# Patient Record
Sex: Female | Born: 1960 | Race: White | Hispanic: No | Marital: Married | Smoking: Current every day smoker
Health system: Southern US, Community
[De-identification: ages and names within clinical notes are randomized; demographics above are authoritative.]

## PROBLEM LIST (undated history)

## (undated) DIAGNOSIS — D126 Benign neoplasm of colon, unspecified: Secondary | ICD-10-CM

## (undated) DIAGNOSIS — F419 Anxiety disorder, unspecified: Secondary | ICD-10-CM

## (undated) DIAGNOSIS — D099 Carcinoma in situ, unspecified: Secondary | ICD-10-CM

## (undated) DIAGNOSIS — K5790 Diverticulosis of intestine, part unspecified, without perforation or abscess without bleeding: Secondary | ICD-10-CM

## (undated) DIAGNOSIS — K648 Other hemorrhoids: Secondary | ICD-10-CM

## (undated) DIAGNOSIS — G709 Myoneural disorder, unspecified: Secondary | ICD-10-CM

## (undated) DIAGNOSIS — M8580 Other specified disorders of bone density and structure, unspecified site: Secondary | ICD-10-CM

## (undated) DIAGNOSIS — E785 Hyperlipidemia, unspecified: Secondary | ICD-10-CM

## (undated) DIAGNOSIS — F329 Major depressive disorder, single episode, unspecified: Secondary | ICD-10-CM

## (undated) DIAGNOSIS — C801 Malignant (primary) neoplasm, unspecified: Secondary | ICD-10-CM

## (undated) DIAGNOSIS — E079 Disorder of thyroid, unspecified: Secondary | ICD-10-CM

## (undated) DIAGNOSIS — M25559 Pain in unspecified hip: Secondary | ICD-10-CM

## (undated) DIAGNOSIS — F32A Depression, unspecified: Secondary | ICD-10-CM

## (undated) HISTORY — DX: Hyperlipidemia, unspecified: E78.5

## (undated) HISTORY — DX: Disorder of thyroid, unspecified: E07.9

## (undated) HISTORY — DX: Malignant (primary) neoplasm, unspecified: C80.1

## (undated) HISTORY — DX: Depression, unspecified: F32.A

## (undated) HISTORY — DX: Anxiety disorder, unspecified: F41.9

## (undated) HISTORY — DX: Other hemorrhoids: K64.8

## (undated) HISTORY — PX: BREAST BIOPSY: SHX20

## (undated) HISTORY — PX: MOHS SURGERY: SHX181

## (undated) HISTORY — PX: WISDOM TOOTH EXTRACTION: SHX21

## (undated) HISTORY — PX: COLONOSCOPY: SHX174

## (undated) HISTORY — DX: Carcinoma in situ, unspecified: D09.9

## (undated) HISTORY — PX: POLYPECTOMY: SHX149

## (undated) HISTORY — PX: CERVICAL CONE BIOPSY: SUR198

## (undated) HISTORY — DX: Diverticulosis of intestine, part unspecified, without perforation or abscess without bleeding: K57.90

## (undated) HISTORY — DX: Myoneural disorder, unspecified: G70.9

## (undated) HISTORY — DX: Benign neoplasm of colon, unspecified: D12.6

## (undated) HISTORY — PX: PARATHYROIDECTOMY: SHX19

## (undated) HISTORY — DX: Major depressive disorder, single episode, unspecified: F32.9

## (undated) HISTORY — DX: Other specified disorders of bone density and structure, unspecified site: M85.80

## (undated) HISTORY — DX: Pain in unspecified hip: M25.559

---

## 1989-06-24 DIAGNOSIS — D099 Carcinoma in situ, unspecified: Secondary | ICD-10-CM

## 1989-06-24 HISTORY — DX: Carcinoma in situ, unspecified: D09.9

## 2008-08-22 HISTORY — PX: ABDOMINAL HYSTERECTOMY: SHX81

## 2009-08-23 ENCOUNTER — Other Ambulatory Visit: Admission: RE | Admit: 2009-08-23 | Discharge: 2009-08-23 | Payer: Self-pay | Admitting: Obstetrics and Gynecology

## 2009-09-01 ENCOUNTER — Encounter: Admission: RE | Admit: 2009-09-01 | Discharge: 2009-09-01 | Payer: Self-pay | Admitting: Obstetrics and Gynecology

## 2009-11-08 ENCOUNTER — Ambulatory Visit: Payer: Self-pay | Admitting: Internal Medicine

## 2009-11-08 DIAGNOSIS — F329 Major depressive disorder, single episode, unspecified: Secondary | ICD-10-CM

## 2009-11-08 DIAGNOSIS — F411 Generalized anxiety disorder: Secondary | ICD-10-CM | POA: Insufficient documentation

## 2009-11-08 DIAGNOSIS — D069 Carcinoma in situ of cervix, unspecified: Secondary | ICD-10-CM | POA: Insufficient documentation

## 2009-11-08 DIAGNOSIS — F17200 Nicotine dependence, unspecified, uncomplicated: Secondary | ICD-10-CM

## 2009-11-08 DIAGNOSIS — R635 Abnormal weight gain: Secondary | ICD-10-CM

## 2009-11-08 DIAGNOSIS — E785 Hyperlipidemia, unspecified: Secondary | ICD-10-CM | POA: Insufficient documentation

## 2009-11-08 LAB — CONVERTED CEMR LAB
AST: 15 units/L (ref 0–37)
Calcium: 9.4 mg/dL (ref 8.4–10.5)
Chloride: 105 meq/L (ref 96–112)
Cholesterol: 261 mg/dL — ABNORMAL HIGH (ref 0–200)
Creatinine, Ser: 0.6 mg/dL (ref 0.4–1.2)
Free T4: 0.6 ng/dL (ref 0.6–1.6)
GFR calc non Af Amer: 105.76 mL/min (ref >60)
HDL: 68.3 mg/dL (ref >39.00)
Lymphocytes Relative: 31.9 % (ref 12.0–46.0)
Lymphs Abs: 2 10*3/uL (ref 0.7–4.0)
MCHC: 34.7 g/dL (ref 30.0–36.0)
Monocytes Absolute: 0.5 10*3/uL (ref 0.1–1.0)
Monocytes Relative: 7.3 % (ref 3.0–12.0)
Neutrophils Relative %: 58.2 % (ref 43.0–77.0)
Platelets: 213 10*3/uL (ref 150.0–400.0)
Potassium: 4.6 meq/L (ref 3.5–5.1)
RDW: 12.9 % (ref 11.5–14.6)
TSH: 1.49 microintl units/mL (ref 0.35–5.50)
Total Bilirubin: 0.5 mg/dL (ref 0.3–1.2)
Total CHOL/HDL Ratio: 4
Triglycerides: 102 mg/dL (ref 0.0–149.0)
VLDL: 20.4 mg/dL (ref 0.0–40.0)

## 2009-12-15 ENCOUNTER — Ambulatory Visit: Payer: Self-pay | Admitting: Internal Medicine

## 2009-12-19 ENCOUNTER — Ambulatory Visit: Payer: Self-pay | Admitting: Family Medicine

## 2009-12-19 ENCOUNTER — Encounter: Payer: Self-pay | Admitting: Internal Medicine

## 2009-12-19 DIAGNOSIS — N760 Acute vaginitis: Secondary | ICD-10-CM | POA: Insufficient documentation

## 2009-12-22 ENCOUNTER — Telehealth: Payer: Self-pay | Admitting: Family Medicine

## 2009-12-22 ENCOUNTER — Encounter: Payer: Self-pay | Admitting: *Deleted

## 2009-12-22 LAB — CONVERTED CEMR LAB: Chlamydia, DNA Probe: NEGATIVE

## 2010-01-17 ENCOUNTER — Ambulatory Visit: Payer: Self-pay | Admitting: Internal Medicine

## 2010-01-17 DIAGNOSIS — R209 Unspecified disturbances of skin sensation: Secondary | ICD-10-CM | POA: Insufficient documentation

## 2010-01-17 DIAGNOSIS — R234 Changes in skin texture: Secondary | ICD-10-CM

## 2010-01-24 ENCOUNTER — Encounter: Payer: Self-pay | Admitting: Internal Medicine

## 2010-02-05 ENCOUNTER — Other Ambulatory Visit: Admission: RE | Admit: 2010-02-05 | Discharge: 2010-02-05 | Payer: Self-pay | Admitting: Obstetrics and Gynecology

## 2010-07-24 NOTE — Assessment & Plan Note (Signed)
Summary: swelling in finger/tingly from elbow to hand/skin on finger l...   Vital Signs:  Patient profile:   50 year old female Menstrual status:  hysterectomy Weight:      162 pounds Pulse rate:   72 / minute BP sitting:   120 / 80  (left arm) Cuff size:   regular  Vitals Entered By: Romualdo Bolk, CMA (AAMA) (January 17, 2010 4:04 PM) CC: 1) Swelling in left ring finger since May. Pt states that it is swollen so much that she has trouble taking off her wedding band. Pt also has a knot under the skin on the finger. 2) Pt has tingling from elbow down on her left side that started 1 month ago. All her fingers are tingling. 3) Pt has soreness in her neck on left side that started 1 month ago as well. She states that this started when she was moving. 4) Pt also has bump on the outside of her vaginal area. Pt has seen Dr. Abner Greenspan about this. Pt has taken the probiotic and it went away but came back. It is sore and under the skin. The skin feels bruised. Pt states that she didn't take the diflucan that was rx.   History of Present Illness: Jessica Cooper comes in today  for SDA for above problems . 1.  Lump in vaginal area  came back  had eval per dr Rogelia Rohrer ? B vsyeast  .  now has days of tender srea left  vaginal area.    2.  feeling of hand swelling and ring tight on left  and then  tingling  pinky area and then when driving   web of thumb on same hand.   no elbow pain but tingling genreates distal arm.    No cp sob  . No trauma but moved recently and had some pain left upper back neck area  . No weakness ot numbness   that is persistent .    Has color change on ring finger  . concerning to her.   Preventive Screening-Counseling & Management  Alcohol-Tobacco     Alcohol drinks/day: 2     Alcohol type: wine     Smoking Status: current     Smoke Cessation Stage: contemplative     Packs/Day: 1.0     Tobacco Counseling: to quit use of tobacco products  Caffeine-Diet-Exercise     Caffeine  use/day: 4     Does Patient Exercise: no  Current Medications (verified): 1)  Citalopram Hydrobromide 10 Mg Tabs (Citalopram Hydrobromide) .Marland Kitchen.. 1 By Mouth Once Daily 2)  Multivitamins   Tabs (Multiple Vitamin) 3)  Vitamin D 1000 Unit  Tabs (Cholecalciferol) 4)  Glucosamine-Chondroitin   Caps (Glucosamine-Chondroit-Vit C-Mn) 5)  Fluconazole 150 Mg Tabs (Fluconazole) .Marland Kitchen.. 1 Tab By Mouth Once 6)  Align  Caps (Probiotic Product)  Allergies (verified): 1)  ! Flagyl  Past History:  Past medical, surgical, family and social histories (including risk factors) reviewed, and no changes noted (except as noted below).  Past Medical History: Reviewed history from 11/08/2009 and no changes required. Depression/ anxiety  Hyperlipidemia about 245  G0P0 Carcinoma in situ 1991   conization   Past Surgical History: Reviewed history from 11/08/2009 and no changes required. Hysterectomy 3 2010  fibroid bleeding.   Past History:  Care Management: Gynecology: Dr Thomasena Edis  Family History: Reviewed history from 11/08/2009 and no changes required. Father: CHF, HBP  ,  heart valve.  Mother: None Siblings:Sister Breast Cancer MGM :  CHF    Social History: Reviewed history from 11/08/2009 and no changes required. Occupation: Dietitian degree Married Current Smoker Alcohol use-yes Drug use-no Regular exercise-no Hhof 2  Moved from [place]  for job.   Review of Systems  The patient denies anorexia, fever, weight loss, weight gain, vision loss, dyspnea on exertion, hematuria, incontinence, genital sores, muscle weakness, difficulty walking, abnormal bleeding, enlarged lymph nodes, and angioedema.    Physical Exam  General:  Well-developed,well-nourished,in no acute distress; alert,appropriate and cooperative throughout examination Head:  normocephalic and atraumatic.   Neck:  No deformities, masses, or tenderness noted. no bony tenderness  Genitalia:  lef labia with  1-1.5 cm   slightly tender cystic nodule lower labia  no lesion ulcers or warts .   No rashes or adenopathy  Msk:  no joint tenderness and no joint swelling.   left ring finger with  indurated thickening of skin over the  proximal finger   slightly darkened .  Nl temp of fingers     no joint swelling otherwise  rom ulbow is normal    some tendernenss left trapezius  Pulses:  pulses intact without delay   Extremities:  no clubbing or cyanosis Neurologic:  alert & oriented X3, strength normal in all extremities, and DTRs symmetrical and normal.    nl grip strength  Skin:  turgor normal, color normal, no suspicious lesions, and no ecchymoses.   Cervical Nodes:  No lymphadenopathy noted Inguinal Nodes:  No significant adenopathy Psych:  Oriented X3.     Impression & Recommendations:  Problem # 1:  TINGLING (ICD-782.0)  left arm  ulnar side but also  medial side  ?  compression issue  no   alarm .sx such as weakness  . with below will get  hand UE consult .   Orders: Orthopedic Referral (Ortho)  Problem # 2:  CHANGES IN SKIN TEXTURE (ICD-782.8)  ring finger   poss from chronic changes form  ring and swelling  but pt very concerned  about this.      Orders: Orthopedic Referral (Ortho)  Problem # 3:  ? of BARTHOLIN'S CYST (ICD-616.2) left    not alarming   disc about poss dx  no evidence of sti.     review labs done by Dr Rogelia Rohrer and negative   for screening   Complete Medication List: 1)  Citalopram Hydrobromide 10 Mg Tabs (Citalopram hydrobromide) .Marland Kitchen.. 1 by mouth once daily 2)  Multivitamins Tabs (Multiple vitamin) 3)  Vitamin D 1000 Unit Tabs (Cholecalciferol) 4)  Glucosamine-chondroitin Caps (Glucosamine-chondroit-vit c-mn) 5)  Fluconazole 150 Mg Tabs (Fluconazole) .Marland Kitchen.. 1 tab by mouth once 6)  Align Caps (Probiotic product) 7)  Keflex 500 Mg Caps (Cephalexin) .Marland Kitchen.. 1 by mouth three times a day  for 7 days  Patient Instructions: 1)  warm compresses at least 3 x per day for 5 mintues or so   can add antibiotic if getting larger. 2)  This could be a bartholins gland  3)  call if persistent or  progressive  .  4)  Will contact you about  referral for tingling in left arm and shkin changes over your ring finger .   5)  avoid leaning on elbow    in the meantime  Prescriptions: KEFLEX 500 MG CAPS (CEPHALEXIN) 1 by mouth three times a day  for 7 days  #21 x 0   Entered and Authorized by:   Madelin Headings MD   Signed by:  Madelin Headings MD on 01/17/2010   Method used:   Print then Give to Patient   RxID:   [id number]

## 2010-07-24 NOTE — Progress Notes (Signed)
Summary: lab results  Phone Note Call from Patient   Caller: Patient Call For: Dr. Abner Greenspan Summary of Call: [phone] Pt is asking for lab results. Initial call taken by: Lynann Beaver CMA,  December 22, 2009 12:01 PM  Follow-up for Phone Call        spoke with patient over phone still having some mild discharge, although it is improved slightly on probiotics, wet prep was not collected appropriately so results on yeast not available and patient  has had yeast with good response to Diflucan in the past. Follow-up by: Danise Edge MD,  December 22, 2009 1:48 PM    New/Updated Medications: FLUCONAZOLE 150 MG TABS (FLUCONAZOLE) 1 tab by mouth once Prescriptions: FLUCONAZOLE 150 MG TABS (FLUCONAZOLE) 1 tab by mouth once  #1 x 0   Entered and Authorized by:   Danise Edge MD   Signed by:   Danise Edge MD on 12/22/2009   Method used:   Electronically to        [facility] [phone]* (retail)       [address]       [place], [place]  [address]       Ph: [phone] or [phone]       Fax: [phone]   RxID:   [id number]

## 2010-07-24 NOTE — Assessment & Plan Note (Signed)
Summary: Follow up/cb   Vital Signs:  Patient profile:   50 year old female Menstrual status:  hysterectomy Weight:      161 pounds Pulse rate:   66 / minute BP sitting:   100 / 60  (right arm) Cuff size:   regular  Vitals Entered By: Romualdo Bolk, CMA (AAMA) (December 15, 2009 3:11 PM) CC: Follow-up visit on labs   History of Present Illness: Jessica Cooper comes in today  for follow up of labs  and pv issues  from last  visit .  Since last visit  here  there have been no major changes in health status  .  Still smoking . No new issues . to discuss.   Preventive Screening-Counseling & Management  Alcohol-Tobacco     Alcohol drinks/day: 2     Alcohol type: wine     Smoking Status: current     Smoke Cessation Stage: contemplative     Packs/Day: 1.0     Tobacco Counseling: to quit use of tobacco products  Caffeine-Diet-Exercise     Caffeine use/day: 4     Does Patient Exercise: no  Hep-HIV-STD-Contraception     Dental Visit-last 6 months yes     Sun Exposure-Excessive: no  Safety-Violence-Falls     Seat Belt Use: yes     Firearms in the Home: no firearms in the home     Smoke Detectors: yes      Blood Transfusions:  no.        Travel History:  [place].    Current Medications (verified): 1)  Citalopram Hydrobromide 10 Mg Tabs (Citalopram Hydrobromide) .Marland Kitchen.. 1 By Mouth Once Daily 2)  Multivitamins   Tabs (Multiple Vitamin) 3)  Vitamin D 1000 Unit  Tabs (Cholecalciferol) 4)  Glucosamine-Chondroitin   Caps (Glucosamine-Chondroit-Vit C-Mn)  Allergies (verified): 1)  ! Flagyl  Past History:  Past medical, surgical, family and social histories (including risk factors) reviewed, and no changes noted (except as noted below).  Past Medical History: Reviewed history from 11/08/2009 and no changes required. Depression/ anxiety  Hyperlipidemia about 245  G0P0 Carcinoma in situ 1991   conization   Past Surgical History: Reviewed history from 11/08/2009 and no  changes required. Hysterectomy 3 2010  fibroid bleeding.   Past History:  Care Management: Gynecology: Dr Thomasena Edis  Family History: Reviewed history from 11/08/2009 and no changes required. Father: CHF, HBP  ,  heart valve.  Mother: None Siblings:Sister Breast Cancer MGM : CHF    Social History: Reviewed history from 11/08/2009 and no changes required. Occupation: Dietitian degree Married Current Smoker Alcohol use-yes Drug use-no Regular exercise-no Hhof 2  Moved from [place]  for job. Dental Care w/in 6 mos.:  yes Sun Exposure-Excessive:  no  Review of Systems  The patient denies anorexia, fever, weight loss, weight gain, vision loss, decreased hearing, hoarseness, chest pain, prolonged cough, abdominal pain, melena, hematochezia, severe indigestion/heartburn, enlarged lymph nodes, angioedema, and breast masses.    Physical Exam  General:  Well-developed,well-nourished,in no acute distress; alert,appropriate and cooperative throughout examination Head:  normocephalic and atraumatic.   Eyes:  PERRL, EOMs full, conjunctiva clear  Ears:  R ear normal, L ear normal, and no external deformities.   Nose:  no external deformity, no external erythema, and no nasal discharge.   Mouth:  pharynx pink and moist.   Neck:  No deformities, masses, or tenderness noted. Breasts:  No mass, nodules, thickening, tenderness, bulging, retraction, inflamation, nipple discharge or skin changes noted.  Lungs:  Normal respiratory effort, chest expands symmetrically. Lungs are clear to auscultation, no crackles or wheezes. Heart:  Normal rate and regular rhythm. S1 and S2 normal without gallop, murmur, click, rub or other extra sounds. Abdomen:  Bowel sounds positive,abdomen soft and non-tender without masses, organomegaly or   noted. Msk:  no clubbing cyanosis or edema  Pulses:  pulses intact without delay   Neurologic:  alert & oriented X3, strength normal in all extremities,  and gait normal.   Skin:  turgor normal, color normal, no suspicious lesions, no ecchymoses, and no petechiae.   Cervical Nodes:  No lymphadenopathy noted Axillary Nodes:  No palpable lymphadenopathy Inguinal Nodes:  No significant adenopathy Psych:  Oriented X3, good eye contact, not anxious appearing, and not depressed appearing.   reviewed labs   Impression & Recommendations:  Problem # 1:  HYPERLIPIDEMIA (ICD-272.4) lifestyle intervention  discussed    consider ation of med but ratio ok and tobacco is her biggest risk factor.   Labs Reviewed: SGOT: 15 (11/08/2009)   SGPT: 15 (11/08/2009)   HDL:68.30 (11/08/2009)  Chol:261 (11/08/2009)  Trig:102.0 (11/08/2009)  Problem # 2:  TOBACCO USE (ICD-305.1) counseled  Encouraged smoking cessation and discussed different methods for smoking cessation.   Problem # 3:  ANXIETY (ICD-300.00)  Her updated medication list for this problem includes:    Citalopram Hydrobromide 10 Mg Tabs (Citalopram hydrobromide) .Marland Kitchen... 1 by mouth once daily  Problem # 4:  PREVENTIVE HEALTH CARE (ICD-V70.0) see parameters  and screening  discussed   Problem # 5:  ABNORMALITY OF RED BLOOD CELLS INC MCV (ICD-790.09) not hypothyroid and no hx of   b12 problems  apparently has had this  before   will follow . could take b12  can recheck periodically  with yearly cbc  Problem # 6:  WEIGHT GAIN (ICD-783.1) no other cause  although med can contribute.  advise with tobacco cessation weight can rise but minimal  if sig lifestyle intervention to counteract metabolic change.  Complete Medication List: 1)  Citalopram Hydrobromide 10 Mg Tabs (Citalopram hydrobromide) .Marland Kitchen.. 1 by mouth once daily 2)  Multivitamins Tabs (Multiple vitamin) 3)  Vitamin D 1000 Unit Tabs (Cholecalciferol) 4)  Glucosamine-chondroitin Caps (Glucosamine-chondroit-vit c-mn)  Patient Instructions: 1)  still rec stop smoking 2)  need  lifestyle intervention for lipids  and  at least yearly check    greater than 50% of visit spent in counseling  25 minutes.  Appended Document: Orders Update EKG  was performed at this visit .  Rate 58 sinus rhythm with nl intervals ,  poor r wave progression  . no acute changes  . No previous to compare  Tamala Ser  MD   Clinical Lists Changes  Orders: Added new Service order of EKG w/ Interpretation (93000) - Signed

## 2010-07-24 NOTE — Assessment & Plan Note (Signed)
Summary: vaginal infection/dm   Vital Signs:  Patient profile:   50 year old female Menstrual status:  hysterectomy Weight:      159 pounds (72.27 kg) O2 Sat:      95 % on Room air Temp:     98.4 degrees F (36.89 degrees C) oral Pulse rate:   76 / minute BP sitting:   120 / 80  (right arm) Cuff size:   regular  Vitals Entered By: Kathrynn Speed CMA (December 19, 2009 3:43 PM)  O2 Flow:  Room air CC: vaginal infection maybe bacterica, itching & burning wiith discharge/ src   History of Present Illness: Patient in today with a 1 week history of increased vaginal discharge. She reports 2 weeks ago her husband had some penile discharge and irritation and was treated with an antibiotic two times a day, was supposed to take 10 days. She is unsure of the name of the antibiotics. She notes the dischasrge is whitish with a mild odor. She is struggling with some burning and she feels a bump under the skin on the labia which is located roughly where she had some unknown ulcerative lesion present over 20 years ago. No f/c/malaise. No unusual back or abdominal pain. Has noted several loose stool a day for the past couple of days. No n/v/anorexia. No CP/palp/SOB.  Current Medications (verified): 1)  Citalopram Hydrobromide 10 Mg Tabs (Citalopram Hydrobromide) .Marland Kitchen.. 1 By Mouth Once Daily 2)  Multivitamins   Tabs (Multiple Vitamin) 3)  Vitamin D 1000 Unit  Tabs (Cholecalciferol) 4)  Glucosamine-Chondroitin   Caps (Glucosamine-Chondroit-Vit C-Mn)  Allergies (verified): 1)  ! Flagyl  Past History:  Past medical history reviewed for relevance to current acute and chronic problems. Social history (including risk factors) reviewed for relevance to current acute and chronic problems.  Past Medical History: Reviewed history from 11/08/2009 and no changes required. Depression/ anxiety  Hyperlipidemia about 245  G0P0 Carcinoma in situ 1991   conization   Social History: Reviewed history from  11/08/2009 and no changes required. Occupation: Dietitian degree Married Current Smoker Alcohol use-yes Drug use-no Regular exercise-no Hhof 2  Moved from [place]  for job.   Review of Systems      See HPI  Physical Exam  General:  Well-developed,well-nourished,in no acute distress; alert,appropriate and cooperative throughout examination Head:  Normocephalic and atraumatic without obvious abnormalities. . Mouth:  Oral mucosa and oropharynx without lesions or exudates. Neck:  No deformities, masses, or tenderness noted. Lungs:  Normal respiratory effort, chest expands symmetrically. Lungs are clear to auscultation, no crackles or wheezes. Heart:  Normal rate and regular rhythm. S1 and S2 normal without gallop, murmur, click, rub or other extra sounds. Abdomen:  Bowel sounds positive,abdomen soft and non-tender without masses Genitalia:  normal introitus, labial erythema, and vaginal discharge whitish and thin. 1cm soft, Nontender circular lesion at posterior aspect of left labia majora. No subrapubic tenderness, no adnexal masses or tenderness.   Impression & Recommendations:  Problem # 1:  UNSPECIFIED VAGINITIS AND VULVOVAGINITIS (ICD-616.10)  Orders: T-GC Probe, genital ([id number]) T-Chlamydia Probe, genital [phone]) T-RPR (Syphilis) [phone]) T-HIV Antibody  (Reflex) [id number]) T-Wet Prep for Christoper Allegra, Clue Cells ([id number]) T-Herpes Simplex Type 2 ([id number])  Did have copious white vaginal discharge and a small 1cm lesion, nt, soft Subcutaneously likely an enlarged LN at posterior aspect of left labia majora. Start Align capsule daily, use cotton undergarments, cleanse after showers externally with distilled white vinegar.  Problem # 2:  TOBACCO USE (  ICD-305.1) Continues to smoke a ppd reports she has been researching and is getting close to quitting. Encouraged complete cessation  Complete Medication List: 1)  Citalopram Hydrobromide  10 Mg Tabs (Citalopram hydrobromide) .Marland Kitchen.. 1 by mouth once daily 2)  Multivitamins Tabs (Multiple vitamin) 3)  Vitamin D 1000 Unit Tabs (Cholecalciferol) 4)  Glucosamine-chondroitin Caps (Glucosamine-chondroit-vit c-mn)  Patient Instructions: 1)  Please schedule a follow-up appointment as needed if symptoms worsen or do not resolve.  2)  Start Align probiotic daily for next month. 3)  Avoid harsh soaps, use cotton undergarments, cleanse externally w/ distilled vinegar after showering.

## 2010-07-24 NOTE — Consult Note (Signed)
Summary: [facility] [place]   Imported By: Maryln Gottron 01/31/2010 12:10:40  _____________________________________________________________________  External Attachment:    Type:   Image     Comment:   External Document

## 2010-07-24 NOTE — Assessment & Plan Note (Signed)
Summary: NEW PT EST // RS   Vital Signs:  Patient profile:   50 year old female Menstrual status:  hysterectomy Height:      65.5 inches Weight:      161 pounds BMI:     26.48 Temp:     97.6 degrees F oral BP sitting:   110 / 70  (right arm)  Vitals Entered By: Kathrynn Speed CMA (Nov 08, 2009 10:40 AM) CC: New pt estblish, pt fasting for Labs     Menstrual Status hysterectomy Last PAP Result normal   History of Present Illness: Jessica Cooper comes in today  for new visit.She  Needs a PCP . Sees Dr Thomasena Edis for  GYNE. Anxiety: on citalopram  for 6-7  years    low dose.     had hx of panic attacks   Last   blood work a long time.Hx of overweight and lost   172-128  andthen increased.     after  tobacco    cessation. and got depressed .     wellbutrin caused   insomnia and chantix insomnia.      Has hx of depression  and elevated lipids  not on medicine  . see hx  document  Preventive Care Screening  Pap Smear:    Date:  08/23/2009    Results:  normal   Mammogram:    Date:  08/22/2009    Results:  normal   Last Tetanus Booster:    Date:  06/25/2003    Results:  Historical    Preventive Screening-Counseling & Management  Alcohol-Tobacco     Alcohol drinks/day: 2     Alcohol type: wine     Smoking Status: current     Packs/Day: 1.0  Caffeine-Diet-Exercise     Caffeine use/day: 4     Does Patient Exercise: no  Safety-Violence-Falls     Seat Belt Use: yes     Firearms in the Home: no firearms in the home     Smoke Detectors: yes      Drug Use:  no.        Blood Transfusions:  no.    Current Medications (verified): 1)  Citalopram Hydrobromide 10 Mg Tabs (Citalopram Hydrobromide) 2)  Multivitamins   Tabs (Multiple Vitamin) 3)  Vitamin D 1000 Unit  Tabs (Cholecalciferol) 4)  Glucosamine-Chondroitin   Caps (Glucosamine-Chondroit-Vit C-Mn)  Allergies (verified): 1)  ! 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   Past History:  Care Management: Gynecology: Dr Thomasena Edis  Family History: Father: CHF, HBP  ,  heart valve.  Mother: None Siblings:Sister Breast Cancer MGM : CHF    Social History: Occupation: Dietitian degree Married Current Smoker Alcohol use-yes Drug use-no Regular exercise-no [place] 2  Moved from [place]  for job. Smoking Status:  current Packs/Day:  1.0 Caffeine use/day:  4 Does Patient Exercise:  no Occupation:  employed Drug Use:  no Seat Belt Use:  yes Blood Transfusions:  no  Review of Systems  The patient denies anorexia, fever, weight loss, weight gain, vision loss, decreased hearing, hoarseness, chest pain, syncope, dyspnea on exertion, peripheral edema, prolonged cough, abdominal pain, melena, hematochezia, severe indigestion/heartburn, hematuria, difficulty walking, abnormal bleeding, enlarged lymph nodes, and angioedema.         hip   pain  at times  Physical  Exam  General:  Well-developed,well-nourished,in no acute distress; alert,appropriate and cooperative throughout examination Head:  normocephalic and atraumatic.   Eyes:  vision grossly intact and pupils equal.  glassses  Ears:  R ear normal, L ear normal, and no external deformities.   Nose:  no external deformity, no external erythema, and no nasal discharge.   Mouth:  pharynx pink and moist.   Neck:  No deformities, masses, or tenderness noted. Lungs:  Normal respiratory effort, chest expands symmetrically. Lungs are clear to auscultation, no crackles or wheezes. Heart:  Normal rate and regular rhythm. S1 and S2 normal without gallop, murmur, click, rub or other extra sounds. Abdomen:  Bowel sounds positive,abdomen soft and non-tender without masses, organomegaly or   noted. Msk:  no clubbing cyanosis or edema  Pulses:  pulses intact without delay   Neurologic:   grossly non focal  Skin:  turgor normal, color normal, no ecchymoses, no petechiae, and no purpura.   Cervical Nodes:  No lymphadenopathy noted Psych:  Oriented X3, good eye contact, not anxious appearing, and not depressed appearing.     Impression & Recommendations:  Problem # 1:  WEIGHT GAIN (ICD-783.1) counseled      Problem # 2:  ANXIETY (ICD-300.00) continue Her updated medication list for this problem includes:    Citalopram Hydrobromide 10 Mg Tabs (Citalopram hydrobromide) .Marland Kitchen... 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 1 by mouth once daily 2)  Multivitamins Tabs (Multiple vitamin) 3)  Vitamin D 1000 Unit Tabs (Cholecalciferol) 4)  Glucosamine-chondroitin Caps (Glucosamine-chondroit-vit c-mn)  Other Orders: TLB-TSH (Thyroid Stimulating Hormone) (84443-TSH) TLB-Hepatic/Liver Function Pnl (80076-HEPATIC) TLB-CBC Platelet - w/Differential (85025-CBCD) TLB-BMP (Basic Metabolic Panel-BMET) (80048-METABOL) TLB-Lipid Panel (80061-LIPID) TLB-T3, Free (Triiodothyronine) (84481-T3FREE) TLB-T4 (Thyrox), Free [phone])  Patient Instructions: 1)  You will be informed of lab results when available.  2)  resistance training alternating with aerobic will help 3)  It is important that you exercise reguarly at least 20 minutes 5 times a week. If you develop chest pain, have severe difficulty breathing, or feel very tired, stop exercising immediately and seek medical attention.  4)  Sleep helps ability to lose weight. 5)  You need to use up 3500 calories more than intake to lose one pound of body weight.  6)  Schedule  CPX  in about a  months ( can make a slot any day but thursday  am) Prescriptions: CITALOPRAM HYDROBROMIDE 10 MG TABS (CITALOPRAM HYDROBROMIDE) 1 by mouth once daily  #30 x 12   Entered and Authorized by:   Madelin Headings MD   Signed by:   Madelin Headings MD on 11/08/2009   Method used:   Electronically to        [facility] [phone]* (retail)       [address]       [place], [place]  [address]       Ph: [phone] or [phone]       Fax: [phone]   RxID:   [id number]

## 2010-07-24 NOTE — Letter (Signed)
Summary: Generic Letter   at [facility]  [address] [place], [place] [address]   Phone: [phone]  Fax: [phone]    12/22/2009  Jessica Cooper [address] [place], [place]  [address]  Dear [name],  Your test results were negative. If you have any questions, please give [place] a call at [phone].         Sincerely,   Tor Netters, CMA (AAMA)

## 2010-10-30 ENCOUNTER — Encounter: Payer: Self-pay | Admitting: Internal Medicine

## 2010-10-31 ENCOUNTER — Encounter: Payer: Self-pay | Admitting: Internal Medicine

## 2010-10-31 ENCOUNTER — Ambulatory Visit (INDEPENDENT_AMBULATORY_CARE_PROVIDER_SITE_OTHER): Payer: 59 | Admitting: Internal Medicine

## 2010-10-31 VITALS — BP 120/80 | HR 78 | Temp 99.0°F | Wt 162.0 lb

## 2010-10-31 DIAGNOSIS — L089 Local infection of the skin and subcutaneous tissue, unspecified: Secondary | ICD-10-CM

## 2010-10-31 DIAGNOSIS — L729 Follicular cyst of the skin and subcutaneous tissue, unspecified: Secondary | ICD-10-CM

## 2010-10-31 DIAGNOSIS — Z8742 Personal history of other diseases of the female genital tract: Secondary | ICD-10-CM | POA: Insufficient documentation

## 2010-10-31 DIAGNOSIS — L723 Sebaceous cyst: Secondary | ICD-10-CM

## 2010-10-31 MED ORDER — FLUCONAZOLE 150 MG PO TABS: ORAL_TABLET | ORAL | Status: AC

## 2010-10-31 MED ORDER — DOXYCYCLINE HYCLATE 100 MG PO TABS: 100.0000 mg | ORAL_TABLET | Freq: Two times a day (BID) | ORAL | Status: AC

## 2010-10-31 NOTE — Progress Notes (Signed)
  Subjective:    Patient ID: Jessica Cooper, female    DOB: [birth year], 50 y.o.   MRN: [id number]  HPI Patient comes in for an acute problem today. She is noted some lumpiness tenderness and now dramatic redness in the right thigh area near her panty line. About 5 years ago she had a problem with possible cysts or lumps in that area that were surgically removed x2. That area has a bit of a scar. However this new area is close to it and is more red than the other problems she's had in the past. She has no fever chills trauma urinary problems adenopathy. She is worried that this could be an infection that began in her bloodstream.   Review of Systems See history of present illness no treatment. She states that she gets yeast infections when she goes on antibiotics and asks for the pill treatment.  Past history family history social history reviewed in the electronic medical record.     Objective:   Physical Exam WDWN   in nad  Right thigh about 3 cm from groin and previous scar    Redness of about 4 cm and faint area of flushed appearrance  With center  Almond size induration  ? Early pimple. There is no discharge. No other areas  And no hidradenitis area is mildly tender.      Assessment & Plan:  Probable skin infection questionable cyst versus hidradenitis.  I cannot tell if this problem was related to her previous cystic problems. Don't see a fistula.  The area of induration is small. At this point we'll use hot compresses antibiotic. She can call us back if getting worse instead of better. In the meantime because of the question of advisability or need for  removal we can get a surgery consult.  I am not sure this is a simple cyst separate from previous problem .  Today the swelling does not look very severe but she is concerned because of the recurrent nature of this problem.  We'll give her doxycycline to take. Prescription for Diflucan to use if needed.

## 2010-10-31 NOTE — Patient Instructions (Signed)
Take antibiotic  Use warm compresses 3-4 x per day. Will do a surgical consult about advisability of other procedures in this area   Call if redness not getting better or  Swelling is much bigger.

## 2010-11-26 ENCOUNTER — Other Ambulatory Visit: Payer: Self-pay | Admitting: Internal Medicine

## 2010-12-31 ENCOUNTER — Ambulatory Visit (INDEPENDENT_AMBULATORY_CARE_PROVIDER_SITE_OTHER): Payer: 59 | Admitting: Internal Medicine

## 2010-12-31 ENCOUNTER — Encounter: Payer: Self-pay | Admitting: Internal Medicine

## 2010-12-31 VITALS — BP 100/70 | HR 72 | Temp 99.6°F

## 2010-12-31 DIAGNOSIS — L309 Dermatitis, unspecified: Secondary | ICD-10-CM

## 2010-12-31 DIAGNOSIS — L089 Local infection of the skin and subcutaneous tissue, unspecified: Secondary | ICD-10-CM

## 2010-12-31 DIAGNOSIS — L259 Unspecified contact dermatitis, unspecified cause: Secondary | ICD-10-CM

## 2010-12-31 MED ORDER — CEPHALEXIN 500 MG PO CAPS: 500.0000 mg | ORAL_CAPSULE | Freq: Two times a day (BID) | ORAL | Status: AC

## 2010-12-31 MED ORDER — VALACYCLOVIR HCL 1 G PO TABS: 2000.0000 mg | ORAL_TABLET | Freq: Two times a day (BID) | ORAL | Status: AC

## 2010-12-31 NOTE — Patient Instructions (Addendum)
i am unsure why you have this lip dermatitis  But will treat for infection viral and abacterial because of the pustules noted. Avoid  Sun and spicy dhemicals  Make up on lips and  Other  Cross  Contaminants  Ok to use plain vaseline.

## 2010-12-31 NOTE — Progress Notes (Signed)
  Subjective:    Patient ID: Jessica Cooper, female    DOB: June 16, 1961, 50 y.o.   MRN: [id number]  HPI Patient comes in for an acute visit. Onset about 2 weeks after drinking wine in sun.  ? Crushed red pepper before this.. She noted that her lips but like they were burning and tingling all over but did not have a rash at that time. Since that time he discontinued and persistently getting worse sometimes swelling in the morning on her lips she stopped all makeup about 3 days ago around her lips and has been using plain Vaseline product without additives or sounds. She's used this before without a problem. Yesterday she is a little Polysporin with the Vaseline. No new topicals or other chemicals makeup or toothpaste. No citrus such as lemon or lime exposures before break out. She does not have a history of cold sores. She has no mouth symptoms fever or swollen glands.  She describes the symptoms as Burn and tingle  .   No itching or allergy itching. Review of Systems See history of present illness no fever nausea vomiting cough or other swelling  Past history family history social history reviewed in the electronic medical record.     Objective:   Physical Exam Well-developed well-nourished in no acute distress with somewhat reddened lips.   Lip plain Vaseline moisturizer no discrete cracks on each side lower lip there is a pustule with white yellow center  but no vesicle. No angioedema of the face OP is clear without obvious lesions. Skin otherwise no acute rashes has nails done.    Assessment & Plan:  Lip dermatitis possibly secondary infection Unclear cause initially sounds like a topical irritant.   Now has pustules. Unsure cause we'll treat for both bacterial and viral infection but I'm unsure if this is the cause. Take the less is best approach is for his topicals. Call if not improving in the next 4-5 days or as needed.

## 2011-03-05 ENCOUNTER — Ambulatory Visit (INDEPENDENT_AMBULATORY_CARE_PROVIDER_SITE_OTHER): Payer: 59 | Admitting: Family Medicine

## 2011-03-05 ENCOUNTER — Encounter: Payer: Self-pay | Admitting: Family Medicine

## 2011-03-05 VITALS — BP 130/80 | Temp 98.8°F

## 2011-03-05 DIAGNOSIS — B029 Zoster without complications: Secondary | ICD-10-CM

## 2011-03-05 MED ORDER — VALACYCLOVIR HCL 1 G PO TABS: 1000.0000 mg | ORAL_TABLET | Freq: Three times a day (TID) | ORAL | Status: AC

## 2011-03-05 NOTE — Progress Notes (Signed)
  Subjective:    Patient ID: Jessica Cooper, female    DOB: 1960-09-25, 50 y.o.   MRN: [id number]  Rash Pertinent negatives include no fever.   Onset of rash right leg 2 days ago. Slightly burning and slightly stinging quality right calf. No other areas of rash. No problems with ambulation. Denies any history of bite. No pruritus. No fever or chills. No aggravating or alleviating factors. Jessica Cooper denies any thigh pain.   Review of Systems  Constitutional: Negative for fever and chills.  Skin: Positive for rash.  Hematological: Negative for adenopathy.       Objective:   Physical Exam  Constitutional: Jessica Cooper appears well-developed and well-nourished.  Cardiovascular: Normal rate and regular rhythm.   Pulmonary/Chest: Breath sounds normal. No respiratory distress. Jessica Cooper has no wheezes. Jessica Cooper has no rales.  Skin:       Rash right posterior calf. Area of erythema approximately 2 x 2 centimeters and near the center Jessica Cooper has a cluster of vesicles. No pustules. Minimally tender          Assessment & Plan:  Shingles. Confined to right calf. Valtrex 1 g 3 times a day for 7 days. Over-the-counter analgesics as needed. Followup as needed

## 2011-03-05 NOTE — Patient Instructions (Signed)
Shingles (Herpes Zoster)  Shingles is caused by the same virus that causes chicken pox (varicella zoster virus or VZV). Shingles often occurs many years or decades after having chicken pox. That is why it is more common in adults older than 50 years. The virus reactivates and breaks out as an infection in a nerve root.  SYMPTOMS   The initial feeling (sensations) may be pain. This pain is usually described as:    Burning.    Stabbing.    Throbbing.    Tingling in the nerve root.    A red rash will follow in a couple days. The rash may occur in any area of the body and is usually on one side (unilateral) of the body in a band or belt-like pattern. The rash usually starts out as very small blisters (vesicles). They will dry up after 7 to 10 days. This is not usually a significant problem except for the pain it causes.    Long lasting (chronic) pain is more likely in an elderly person. It can last months to years. This condition is called post-herpetic neuralgia.   Shingles can be an extremely severe infection in someone with AIDS, a weakened immune system or with forms of leukemia. It can also be severe if you are taking transplant medications or other medications that weaken the immune system.  TREATMENT  Your caregiver will often treat you with:   Antiviral drugs.    Anti-inflammatory drugs.    Pain medications.    Bed rest is very important in preventing the pain associated with herpes zoster (post-herpetic neuralgia).    Application of heat in the form of a hot-water bottle or electric heating pad or gentle pressure with the hand is recommended to help with the pain or discomfort.   PREVENTION  A varicella zoster vaccine is available to help protect against the virus. The Food and Drug Administration approved the varicella zoster vaccine for individuals 50 years of age and older.  HOME CARE INSTRUCTIONS   Cool compresses to the area of rash may be helpful.    Only take over-the-counter or  prescription medicines for pain, discomfort or fever as directed by your caregiver.    Avoid contact with:    Babies.    Pregnant women.    Children with eczema.    Elderly people with transplants.    People with chronic illnesses, such as leukemia and AIDS.    If the area involved is on your face, you may receive a referral for follow-up to a specialist. It is very important to keep all follow-up appointments. This will help avoid eye complications, chronic pain or disability.   SEEK IMMEDIATE MEDICAL CARE IF:   You develop any pain (headache) in the area of the face or eye. This must be followed carefully by your caregiver or ophthalmologist. An infection in part of your eye (cornea) can be very serious. It could lead to blindness.    You do not have pain relief from prescribed medications.    The redness or swelling spreads.    The area involved becomes very swollen and painful.    You have an oral temperature above 102 F (38 C), not controlled by medicine.    You notice any red or painful lines extending away from the affected area toward your heart (lymphangitis).    Your condition is worsening or has changed.   Document Released: 06/10/2005 Document Re-Released: 11/28/2009  Exit[employer] Patient Information 2011 [employer].

## 2011-10-22 ENCOUNTER — Other Ambulatory Visit: Payer: Self-pay | Admitting: Internal Medicine

## 2012-09-09 ENCOUNTER — Other Ambulatory Visit: Payer: Self-pay | Admitting: Internal Medicine

## 2012-09-18 ENCOUNTER — Ambulatory Visit (INDEPENDENT_AMBULATORY_CARE_PROVIDER_SITE_OTHER): Payer: 59 | Admitting: Internal Medicine

## 2012-09-18 ENCOUNTER — Encounter: Payer: Self-pay | Admitting: Internal Medicine

## 2012-09-18 VITALS — BP 112/82 | HR 68 | Temp 98.5°F | Wt 155.0 lb

## 2012-09-18 DIAGNOSIS — F411 Generalized anxiety disorder: Secondary | ICD-10-CM

## 2012-09-18 DIAGNOSIS — E785 Hyperlipidemia, unspecified: Secondary | ICD-10-CM

## 2012-09-18 DIAGNOSIS — Z872 Personal history of diseases of the skin and subcutaneous tissue: Secondary | ICD-10-CM

## 2012-09-18 DIAGNOSIS — Z299 Encounter for prophylactic measures, unspecified: Secondary | ICD-10-CM

## 2012-09-18 DIAGNOSIS — Z79899 Other long term (current) drug therapy: Secondary | ICD-10-CM | POA: Insufficient documentation

## 2012-09-18 DIAGNOSIS — R350 Frequency of micturition: Secondary | ICD-10-CM

## 2012-09-18 DIAGNOSIS — F17200 Nicotine dependence, unspecified, uncomplicated: Secondary | ICD-10-CM

## 2012-09-18 LAB — HEPATIC FUNCTION PANEL
AST: 16 U/L (ref 0–37)
Total Bilirubin: 0.5 mg/dL (ref 0.3–1.2)
Total Protein: 6.9 g/dL (ref 6.0–8.3)

## 2012-09-18 LAB — CBC WITH DIFFERENTIAL/PLATELET
Basophils Absolute: 0 10*3/uL (ref 0.0–0.1)
Basophils Relative: 0.6 % (ref 0.0–3.0)
MCHC: 34.1 g/dL (ref 30.0–36.0)
Monocytes Relative: 6.8 % (ref 3.0–12.0)
Neutrophils Relative %: 63 % (ref 43.0–77.0)
RBC: 4.14 Mil/uL (ref 3.87–5.11)
WBC: 6.3 10*3/uL (ref 4.5–10.5)

## 2012-09-18 LAB — POCT URINALYSIS DIP (MANUAL ENTRY)
Bilirubin, UA: NEGATIVE
Glucose, UA: NEGATIVE
Nitrite, UA: NEGATIVE
Protein Ur, POC: NEGATIVE
Spec Grav, UA: 1.015
Urobilinogen, UA: 0.2

## 2012-09-18 LAB — BASIC METABOLIC PANEL
BUN: 11 mg/dL (ref 6–23)
CO2: 27 mEq/L (ref 19–32)
Glucose, Bld: 101 mg/dL — ABNORMAL HIGH (ref 70–99)

## 2012-09-18 LAB — LIPID PANEL: Total CHOL/HDL Ratio: 4

## 2012-09-18 MED ORDER — CITALOPRAM HYDROBROMIDE 10 MG PO TABS: ORAL_TABLET | ORAL | Status: DC

## 2012-09-18 NOTE — Patient Instructions (Addendum)
Contact us when want to refer for colonoscopy. .    Will notify you  of labs when available. Stopping tobacco is the best thing  You can do for your future health. Avoid pleasant situations with the smoking and can change to nicotine   Patch etc.  Yearly visit or as needed.

## 2012-09-18 NOTE — Progress Notes (Signed)
Chief Complaint  Patient presents with  . Follow-up    Meds    HPI: follow up; No major injuries, ed visits ,hospitalizations , new medications since last visit. Over a year ago.  No major change in health status since last visit . celexa 10 mg   seems to control her panic and anxiety although she's been out of the medication for a few days. Wants to continue staying on this at 20 mg she felt flat and unemotional. Her dermatologist diagnosed hidradenittis  Suppurative in the groin area it is controlled. She is up-to-date on mammogram sees her gynecologist. Still continues to smoke one pack per day does have some knee cigarettes is going to try. Husband smokes but not in much ROS: See pertinent positives and negatives per HPI. Negative for chest pain shortness of breath major injury vision hearing problems respiratory problems. Has some urinary frequency without UTI symptoms fever or flank pain Has a history of elevated cholesterol tries to eat healthy  Past Medical History  Diagnosis Date  . Depression   . Anxiety   . Hyperlipidemia     runs around 245  . 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 Heart failure Father     heart valve problem   . Hypertension Father   . Breast cancer Sister   . Heart failure Maternal Grandmother     History   Social History  . Marital Status: Married    Spouse Name: N/A    Number of Children: N/A  . Years of Education: N/A   Social History Main Topics  . Smoking status: Current Every Day Smoker  . Smokeless tobacco: None  . Alcohol Use: No  . Drug Use: No  . Sexually Active:    Other Topics Concern  . None   Social History Narrative   Occupation: Landscape architect   Married   Regular exercise- no   Smoker    HH of 2   5 pets  2 dogs and cats.  One outside cat.    Moved from [place] from job   G0P0    Outpatient Encounter Prescriptions as of 09/18/2012   Medication Sig Dispense Refill  . aspirin 81 MG tablet Take 81 mg by mouth daily.        . Biotin 10 MG TABS Take by mouth.        . cholecalciferol (VITAMIN D) 1000 UNITS tablet Take 1,000 Units by mouth daily.        . citalopram (CELEXA) 10 MG tablet TAKE 1 TABLET EVERY DAY  90 tablet  3  . COD LIVER OIL PO Take by mouth.        Marland Kitchen glucosamine-chondroitin 500-400 MG tablet Take 1 tablet by mouth 3 (three) times daily.        . MULTIPLE VITAMIN PO Take by mouth.        . Probiotic Product (ALIGN) 4 MG CAPS Take by mouth.        . [DISCONTINUED] citalopram (CELEXA) 10 MG tablet TAKE 1 TABLET EVERY DAY  30 tablet  9   No facility-administered encounter medications on file as of 09/18/2012.    EXAM:  BP 112/82  Pulse 68  Temp(Src) 98.5 F (36.9 C)  Wt 155 lb (70.308 kg)  BMI 25.39 kg/m2  SpO2 98%  Body mass index is 25.39 kg/(m^2).  GENERAL: vitals reviewed and listed above, alert, oriented,  appears well hydrated and in no acute distress HEENT: atraumatic, conjunctiva  clear, no obvious abnormalities on inspection of external nose and ears OP : no lesion edema or exudate   NECK: no obvious masses on inspection palpation no bruits are heard no adenopathy LUNGS: clear to auscultation bilaterally, no wheezes, rales or rhonchi, good air movement CV: HRRR, no clubbing cyanosis or  peripheral edema nl cap refill  Abdomen soft without organomegaly guarding or rebound MS: moves all extremities without noticeable focal  abnormality PSYCH: pleasant and cooperative, no obvious depression or anxiety  ASSESSMENT AND PLAN:  Discussed the following assessment and plan:  HYPERLIPIDEMIA - Plan: Basic metabolic panel, CBC with Differential, Hepatic function panel, Lipid panel, TSH, POCT urinalysis dipstick  TOBACCO USE - Counseled on techniques for cessation switch over to nicotine replacement etc.  ANXIETY - Controlled by medication continue doing well  Medication management  Urinary  frequency - Urinalysis  - Plan: Basic metabolic panel, CBC with Differential, Hepatic function panel, Lipid panel, TSH, POCT urinalysis dipstick  Preventive measure - Screening labs discussed getting colonoscopy when she is 52 she can contact us for that for referral - Plan: Basic metabolic panel, CBC with Differential, Hepatic function panel, Lipid panel, TSH, POCT urinalysis dipstick  H/O hidradenitis suppurativa  -Patient advised to return or notify health care team  if symptoms worsen or persist or new concerns arise.  Patient Instructions  Contact us when want to refer for colonoscopy. .    Will notify you  of labs when available. Stopping tobacco is the best thing  You can do for your future health. Avoid pleasant situations with the smoking and can change to nicotine   Patch etc.  Yearly visit or as needed.       Neta Mends. Panosh M.D.

## 2013-01-04 ENCOUNTER — Ambulatory Visit (INDEPENDENT_AMBULATORY_CARE_PROVIDER_SITE_OTHER): Payer: 59 | Admitting: Internal Medicine

## 2013-01-04 ENCOUNTER — Encounter: Payer: Self-pay | Admitting: Internal Medicine

## 2013-01-04 ENCOUNTER — Telehealth: Payer: Self-pay | Admitting: Internal Medicine

## 2013-01-04 VITALS — BP 106/66 | HR 59 | Temp 98.4°F | Wt 153.0 lb

## 2013-01-04 DIAGNOSIS — R519 Headache, unspecified: Secondary | ICD-10-CM | POA: Insufficient documentation

## 2013-01-04 DIAGNOSIS — K044 Acute apical periodontitis of pulpal origin: Secondary | ICD-10-CM

## 2013-01-04 DIAGNOSIS — R51 Headache: Secondary | ICD-10-CM | POA: Insufficient documentation

## 2013-01-04 DIAGNOSIS — K047 Periapical abscess without sinus: Secondary | ICD-10-CM

## 2013-01-04 DIAGNOSIS — R238 Other skin changes: Secondary | ICD-10-CM

## 2013-01-04 LAB — CBC WITH DIFFERENTIAL/PLATELET
Basophils Absolute: 0 10*3/uL (ref 0.0–0.1)
HCT: 40.7 % (ref 36.0–46.0)
Hemoglobin: 14 g/dL (ref 12.0–15.0)
Lymphs Abs: 2.1 10*3/uL (ref 0.7–4.0)
MCHC: 34.4 g/dL (ref 30.0–36.0)
MCV: 103.4 fl — ABNORMAL HIGH (ref 78.0–100.0)
Monocytes Absolute: 0.4 10*3/uL (ref 0.1–1.0)
RBC: 3.94 Mil/uL (ref 3.87–5.11)

## 2013-01-04 LAB — APTT: aPTT: 27.7 s (ref 21.7–28.8)

## 2013-01-04 NOTE — Patient Instructions (Signed)
It is possible that Penicillins can  Increase  tendency to belled temporarily  So can ibuprofen . To be safe  Ask dentist to change antibiotic to non penicillin  For now. If  persistent or progressive or any concerns  May get hematology opinion.    Continue sleep hygieine  ambien type meds are still dependent producing if taking regularly and can cause  Memory problems the next day.    Insomnia Insomnia is frequent trouble falling and/or staying asleep. Insomnia can be a long term problem or a short term problem. Both are common. Insomnia can be a short term problem when the wakefulness is related to a certain stress or worry. Long term insomnia is often related to ongoing stress during waking hours and/or poor sleeping habits. Overtime, sleep deprivation itself can make the problem worse. Every little thing feels more severe because you are overtired and your ability to cope is decreased. CAUSES   Stress, anxiety, and depression.  Poor sleeping habits.  Distractions such as TV in the bedroom.  Naps close to bedtime.  Engaging in emotionally charged conversations before bed.  Technical reading before sleep.  Alcohol and other sedatives. They may make the problem worse. They can hurt normal sleep patterns and normal dream activity.  Stimulants such as caffeine for several hours prior to bedtime.  Pain syndromes and shortness of breath can cause insomnia.  Exercise late at night.  Changing time zones may cause sleeping problems (jet lag). It is sometimes helpful to have someone observe your sleeping patterns. They should look for periods of not breathing during the night (sleep apnea). They should also look to see how long those periods last. If you live alone or observers are uncertain, you can also be observed at a sleep clinic where your sleep patterns will be professionally monitored. Sleep apnea requires a checkup and treatment. Give your caregivers your medical history. Give your  caregivers observations your family has made about your sleep.  SYMPTOMS   Not feeling rested in the morning.  Anxiety and restlessness at bedtime.  Difficulty falling and staying asleep. TREATMENT   Your caregiver may prescribe treatment for an underlying medical disorders. Your caregiver can give advice or help if you are using alcohol or other drugs for self-medication. Treatment of underlying problems will usually eliminate insomnia problems.  Medications can be prescribed for short time use. They are generally not recommended for lengthy use.  Over-the-counter sleep medicines are not recommended for lengthy use. They can be habit forming.  You can promote easier sleeping by making lifestyle changes such as:  Using relaxation techniques that help with breathing and reduce muscle tension.  Exercising earlier in the day.  Changing your diet and the time of your last meal. No night time snacks.  Establish a regular time to go to bed.  Counseling can help with stressful problems and worry.  Soothing music and white noise may be helpful if there are background noises you cannot remove.  Stop tedious detailed work at least one hour before bedtime. HOME CARE INSTRUCTIONS   Keep a diary. Inform your caregiver about your progress. This includes any medication side effects. See your caregiver regularly. Take note of:  Times when you are asleep.  Times when you are awake during the night.  The quality of your sleep.  How you feel the next day. This information will help your caregiver care for you.  Get out of bed if you are still awake after 15 minutes. Read  or do some quiet activity. Keep the lights down. Wait until you feel sleepy and go back to bed.  Keep regular sleeping and waking hours. Avoid naps.  Exercise regularly.  Avoid distractions at bedtime. Distractions include watching television or engaging in any intense or detailed activity like attempting to balance  the household checkbook.  Develop a bedtime ritual. Keep a familiar routine of bathing, brushing your teeth, climbing into bed at the same time each night, listening to soothing music. Routines increase the success of falling to sleep faster.  Use relaxation techniques. This can be using breathing and muscle tension release routines. It can also include visualizing peaceful scenes. You can also help control troubling or intruding thoughts by keeping your mind occupied with boring or repetitive thoughts like the old concept of counting sheep. You can make it more creative like imagining planting one beautiful flower after another in your backyard garden.  During your day, work to eliminate stress. When this is not possible use some of the previous suggestions to help reduce the anxiety that accompanies stressful situations. MAKE SURE YOU:   Understand these instructions.  Will watch your condition.  Will get help right away if you are not doing well or get worse. Document Released: 06/07/2000 Document Revised: 09/02/2011 Document Reviewed: 07/08/2007 [facility] Patient Information 2014 [place], [place].

## 2013-01-04 NOTE — Telephone Encounter (Signed)
Patient Information:  Caller Name: Nancy  Phone: [phone]  Patient: Jessica Cooper, Jessica Cooper  Gender: Female  DOB: 1960/11/07  Age: 52 Years  PCP: Berniece Andreas (Family Practice)  Pregnant: No  Office Follow Up:  Does the office need to follow up with this patient?: No  Instructions For The Office: N/A   Symptoms  Reason For Call & Symptoms: Patient states she was seen at Dentist for abscessed tooth; on  Amoxicillin 500 mg since  12/31/12.  Relates she had blood vessel burst in right ring finger and veins in left hand are prominent / blue  in appearance.  Reports soreness, but not pain.  She asks if she should continue Amoxicillin or not.  Per No Guideline protocol  Reviewed Health History In EMR: Yes  Reviewed Medications In EMR: Yes  Reviewed Allergies In EMR: Yes  Reviewed Surgeries / Procedures: Yes  Date of Onset of Symptoms: 01/03/2013 OB / GYN:  LMP: Unknown  Guideline(s) Used:  No Protocol Available - Sick Adult  Disposition Per Guideline:   See Today in Office  Reason For Disposition Reached:   Nursing judgment  Advice Given:  Call Back If:  New symptoms develop  You become worse.  Patient Will Follow Care Advice:  YES  Appointment Scheduled:  01/04/2013 10:30:00 Appointment Scheduled Provider:  Berniece Andreas [facility])

## 2013-01-04 NOTE — Progress Notes (Signed)
Chief Complaint  Patient presents with  . Swollen ring finger/Rt    HPI: Patient comes  For SDA new problem   . 4 days has started on amoxicillin from her dentist for a dental infection. i she did discuss with: Nurse and comes in today because she has a bruising situation on her right  ring finger distally. 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 She does tend to bruise easily he always but is never had any serious bleeding. She is also had a headache felt from the dental infection given  tylenol 3 and alt with  ibu 800 mg .   No fever nose bleeds.  In office right hand began to have a bruise blotch no pain  here to check.  ROS: See pertinent positives and negatives per HPI. No current chest pain shortness of breath vision changes neurologic changes no acute joint swelling  Patient asks in addition to her whether she should try some Ambien as she has some chronic sleep problems. Past Medical History  Diagnosis Date  . Depression   . Anxiety   . Hyperlipidemia     runs around 245  . 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 Heart failure Father     heart valve problem   . Hypertension Father   . Breast cancer Sister   . Heart failure Maternal Grandmother     History   Social History  . Marital Status: Married    Spouse Name: N/A    Number of Children: N/A  . Years of Education: N/A   Social History Main Topics  . Smoking status: Current Every Day Smoker  . Smokeless tobacco: None  . Alcohol Use: No  . Drug Use: No  . Sexually Active:    Other Topics Concern  . None   Social History Narrative   Occupation: Landscape architect   Married   Regular exercise- no   Smoker    HH of 2   5 pets  2 dogs and cats.  One outside cat.    Moved from [place] from job   G0P0    Outpatient Encounter Prescriptions as of 01/04/2013  Medication Sig Dispense Refill  . aspirin 81 MG tablet Take 81 mg by mouth  daily.        . Biotin 10 MG TABS Take by mouth.        . cholecalciferol (VITAMIN D) 1000 UNITS tablet Take 1,000 Units by mouth daily.        . citalopram (CELEXA) 10 MG tablet TAKE 1 TABLET EVERY DAY  90 tablet  3  . COD LIVER OIL PO Take by mouth.        Marland Kitchen glucosamine-chondroitin 500-400 MG tablet Take 1 tablet by mouth 3 (three) times daily.        . MULTIPLE VITAMIN PO Take by mouth.        . Probiotic Product (ALIGN) 4 MG CAPS Take by mouth.         No facility-administered encounter medications on file as of 01/04/2013.    EXAM:  BP 106/66  Pulse 59  Temp(Src) 98.4 F (36.9 C) (Oral)  Wt 153 lb (69.4 kg)  BMI 25.06 kg/m2  SpO2 98%  Body mass index is 25.06 kg/(m^2).  GENERAL: vitals reviewed and listed above, alert, oriented, appears well hydrated and in no acute distress HEENT: atraumatic, conjunctiva  clear, no  obvious abnormalities on inspection of external nose and ears OP : no lesion edema or exudate  NECK: no obvious masses on inspection palpation no adenopathy LUNGS: clear to auscultation bilaterally, no wheezes, rales or rhonchi, good air movement CV: HRRR, no clubbing cyanosis or  peripheral edema nl cap refill  Abdomen soft without organomegaly noted guarding or rebound MS: moves all extremities no acute joint swelling. Skin; right ring finger distally has a breezy-like discoloration with mild swelling but no effusion distal to the PIP joint. No petechiae normal capillary refill left hand at base of index finger on palmar surface has a purplish bruised area that is nontender. Normal pulses.  New the end of the visit on the right hand there was bluish discolor sedation at the DIPJ area with no specific bruise .   Upper arms with tiny angiomas the patient states her old on forearms flexor surfaces there number of petechial-like lesions but none on palms or soles legs look clear few venous varicosities PSYCH: pleasant and cooperative, no obvious depression or  anxiety  ASSESSMENT AND PLAN:  Discussed the following assessment and plan:  Easy bruising ? - Unusual changes on hand cannot explain scuffed platelet defect from penicillins she's also taken ibuprofen and has had Celexa - Plan: CBC with Differential, Protime-INR, APTT, Ambulatory referral to Hematology  Dental infection - Under treatment with amoxicillin  Headache(784.0) - Felt to be possibly from dental infection ?  Forearms with discrete ? Angioma vs petechial lesions  Uncertain sig  ibu pcn and celexa can all add to bruising but this seems very atypical .  Get consult . Non emergen unless abnormal labs . Would not advise Ambien on a long-term basis I told her I use it her situational sleep problems otherwise it is easily dependent producing ;she agrees not right for her at this time.  tends to sleep hygiene for now -Patient advised to return or notify health care team  if symptoms worsen or persist or new concerns arise.  Patient Instructions  It is possible that Penicillins can  Increase  tendency to belled temporarily  So can ibuprofen . To be safe  Ask dentist to change antibiotic to non penicillin  For now. If  persistent or progressive or any concerns  May get hematology opinion.    Continue sleep hygieine  ambien type meds are still dependent producing if taking regularly and can cause  Memory problems the next day.    Insomnia Insomnia is frequent trouble falling and/or staying asleep. Insomnia can be a long term problem or a short term problem. Both are common. Insomnia can be a short term problem when the wakefulness is related to a certain stress or worry. Long term insomnia is often related to ongoing stress during waking hours and/or poor sleeping habits. Overtime, sleep deprivation itself can make the problem worse. Every little thing feels more severe because you are overtired and your ability to cope is decreased. CAUSES   Stress, anxiety, and depression.  Poor  sleeping habits.  Distractions such as TV in the bedroom.  Naps close to bedtime.  Engaging in emotionally charged conversations before bed.  Technical reading before sleep.  Alcohol and other sedatives. They may make the problem worse. They can hurt normal sleep patterns and normal dream activity.  Stimulants such as caffeine for several hours prior to bedtime.  Pain syndromes and shortness of breath can cause insomnia.  Exercise late at night.  Changing time zones may cause sleeping problems (jet  lag). It is sometimes helpful to have someone observe your sleeping patterns. They should look for periods of not breathing during the night (sleep apnea). They should also look to see how long those periods last. If you live alone or observers are uncertain, you can also be observed at a sleep clinic where your sleep patterns will be professionally monitored. Sleep apnea requires a checkup and treatment. Give your caregivers your medical history. Give your caregivers observations your family has made about your sleep.  SYMPTOMS   Not feeling rested in the morning.  Anxiety and restlessness at bedtime.  Difficulty falling and staying asleep. TREATMENT   Your caregiver may prescribe treatment for an underlying medical disorders. Your caregiver can give advice or help if you are using alcohol or other drugs for self-medication. Treatment of underlying problems will usually eliminate insomnia problems.  Medications can be prescribed for short time use. They are generally not recommended for lengthy use.  Over-the-counter sleep medicines are not recommended for lengthy use. They can be habit forming.  You can promote easier sleeping by making lifestyle changes such as:  Using relaxation techniques that help with breathing and reduce muscle tension.  Exercising earlier in the day.  Changing your diet and the time of your last meal. No night time snacks.  Establish a regular time to go  to bed.  Counseling can help with stressful problems and worry.  Soothing music and white noise may be helpful if there are background noises you cannot remove.  Stop tedious detailed work at least one hour before bedtime. HOME CARE INSTRUCTIONS   Keep a diary. Inform your caregiver about your progress. This includes any medication side effects. See your caregiver regularly. Take note of:  Times when you are asleep.  Times when you are awake during the night.  The quality of your sleep.  How you feel the next day. This information will help your caregiver care for you.  Get out of bed if you are still awake after 15 minutes. Read or do some quiet activity. Keep the lights down. Wait until you feel sleepy and go back to bed.  Keep regular sleeping and waking hours. Avoid naps.  Exercise regularly.  Avoid distractions at bedtime. Distractions include watching television or engaging in any intense or detailed activity like attempting to balance the household checkbook.  Develop a bedtime ritual. Keep a familiar routine of bathing, brushing your teeth, climbing into bed at the same time each night, listening to soothing music. Routines increase the success of falling to sleep faster.  Use relaxation techniques. This can be using breathing and muscle tension release routines. It can also include visualizing peaceful scenes. You can also help control troubling or intruding thoughts by keeping your mind occupied with boring or repetitive thoughts like the old concept of counting sheep. You can make it more creative like imagining planting one beautiful flower after another in your backyard garden.  During your day, work to eliminate stress. When this is not possible use some of the previous suggestions to help reduce the anxiety that accompanies stressful situations. MAKE SURE YOU:   Understand these instructions.  Will watch your condition.  Will get help right away if you are not  doing well or get worse. Document Released: 06/07/2000 Document Revised: 09/02/2011 Document Reviewed: 07/08/2007 [facility] Patient Information 2014 [place], [place].      Neta Mends. Zuzu Befort M.D.  addendum Uncertain if she is taking asa as again certainly would add to this  problem .

## 2013-01-07 ENCOUNTER — Encounter: Payer: Self-pay | Admitting: Family Medicine

## 2013-01-08 ENCOUNTER — Telehealth: Payer: Self-pay | Admitting: Hematology & Oncology

## 2013-01-08 NOTE — Telephone Encounter (Signed)
Left pt message to call and schedule appointment °

## 2013-01-11 ENCOUNTER — Telehealth: Payer: Self-pay | Admitting: Hematology & Oncology

## 2013-01-11 NOTE — Telephone Encounter (Signed)
Left message to call for appointment, husband said wasn't sure she was coming here but would have her call.

## 2013-01-12 ENCOUNTER — Telehealth: Payer: Self-pay | Admitting: Hematology & Oncology

## 2013-01-12 ENCOUNTER — Telehealth: Payer: Self-pay | Admitting: Internal Medicine

## 2013-01-12 NOTE — Telephone Encounter (Signed)
Contact patient  .  We can cancel the referral only  if she feels has no further excess bruising  But need to have patient  Confirm that things are going well .

## 2013-01-12 NOTE — Telephone Encounter (Signed)
Called from Oncology and stated that they have tried to schedule with the patient 3 different times, but have not suceeded. Jessica Cooper states that he spoke with the patients husband yesterday and the husband stated "I dont know if she is going to come for this". FYI.  I

## 2013-01-12 NOTE — Telephone Encounter (Signed)
Left pt message to call and schedule appointment. Jessica Cooper referring office aware I have left 3 messages to schedule

## 2013-01-13 NOTE — Telephone Encounter (Signed)
Spoke to the patient. She did not want to go see oncology at this time.  Says she has always bruised easily.  The bruising is gone and she is having no other problems at this time.  Instructed for her to call back if sx start again for new referral.

## 2013-04-29 ENCOUNTER — Other Ambulatory Visit: Payer: Self-pay

## 2013-06-24 LAB — HM MAMMOGRAPHY

## 2013-09-24 ENCOUNTER — Other Ambulatory Visit: Payer: Self-pay | Admitting: Internal Medicine

## 2013-10-05 ENCOUNTER — Telehealth: Payer: Self-pay | Admitting: Internal Medicine

## 2013-10-05 ENCOUNTER — Encounter: Payer: Self-pay | Admitting: Internal Medicine

## 2013-10-05 ENCOUNTER — Ambulatory Visit (INDEPENDENT_AMBULATORY_CARE_PROVIDER_SITE_OTHER): Payer: 59 | Admitting: Internal Medicine

## 2013-10-05 VITALS — BP 102/68 | HR 67 | Temp 99.2°F | Ht 65.5 in | Wt 138.0 lb

## 2013-10-05 DIAGNOSIS — G4700 Insomnia, unspecified: Secondary | ICD-10-CM

## 2013-10-05 DIAGNOSIS — E785 Hyperlipidemia, unspecified: Secondary | ICD-10-CM

## 2013-10-05 DIAGNOSIS — F17200 Nicotine dependence, unspecified, uncomplicated: Secondary | ICD-10-CM

## 2013-10-05 DIAGNOSIS — Z299 Encounter for prophylactic measures, unspecified: Secondary | ICD-10-CM

## 2013-10-05 DIAGNOSIS — Z79899 Other long term (current) drug therapy: Secondary | ICD-10-CM

## 2013-10-05 DIAGNOSIS — F411 Generalized anxiety disorder: Secondary | ICD-10-CM

## 2013-10-05 LAB — BASIC METABOLIC PANEL
BUN: 10 mg/dL (ref 6–23)
CALCIUM: 10.3 mg/dL (ref 8.4–10.5)
CO2: 25 meq/L (ref 19–32)
Chloride: 105 mEq/L (ref 96–112)
Creatinine, Ser: 0.7 mg/dL (ref 0.4–1.2)
GFR: 89.4 mL/min (ref >60.00)
GLUCOSE: 88 mg/dL (ref 70–99)
POTASSIUM: 4 meq/L (ref 3.5–5.1)
Sodium: 138 mEq/L (ref 135–145)

## 2013-10-05 LAB — CBC WITH DIFFERENTIAL/PLATELET
BASOS ABS: 0 10*3/uL (ref 0.0–0.1)
Basophils Relative: 0.6 % (ref 0.0–3.0)
EOS ABS: 0.1 10*3/uL (ref 0.0–0.7)
EOS PCT: 1.5 % (ref 0.0–5.0)
HEMATOCRIT: 42.4 % (ref 36.0–46.0)
Hemoglobin: 14.5 g/dL (ref 12.0–15.0)
LYMPHS ABS: 2.1 10*3/uL (ref 0.7–4.0)
Lymphocytes Relative: 30.3 % (ref 12.0–46.0)
MCHC: 34.2 g/dL (ref 30.0–36.0)
MCV: 102.6 fl — AB (ref 78.0–100.0)
MONO ABS: 0.4 10*3/uL (ref 0.1–1.0)
Monocytes Relative: 5.7 % (ref 3.0–12.0)
NEUTROS ABS: 4.2 10*3/uL (ref 1.4–7.7)
Neutrophils Relative %: 61.9 % (ref 43.0–77.0)
PLATELETS: 187 10*3/uL (ref 150.0–400.0)
RBC: 4.14 Mil/uL (ref 3.87–5.11)
RDW: 13 % (ref 11.5–14.6)
WBC: 6.8 10*3/uL (ref 4.5–10.5)

## 2013-10-05 LAB — HEPATIC FUNCTION PANEL
ALK PHOS: 54 U/L (ref 39–117)
ALT: 16 U/L (ref 0–35)
AST: 18 U/L (ref 0–37)
Albumin: 4.1 g/dL (ref 3.5–5.2)
BILIRUBIN TOTAL: 0.4 mg/dL (ref 0.3–1.2)
Bilirubin, Direct: 0.1 mg/dL (ref 0.0–0.3)
Total Protein: 6.9 g/dL (ref 6.0–8.3)

## 2013-10-05 LAB — LIPID PANEL
CHOL/HDL RATIO: 3
CHOLESTEROL: 228 mg/dL — AB (ref 0–200)
HDL: 66.9 mg/dL (ref >39.00)
LDL Cholesterol: 149 mg/dL — ABNORMAL HIGH (ref 0–99)
TRIGLYCERIDES: 60 mg/dL (ref 0.0–149.0)
VLDL: 12 mg/dL (ref 0.0–40.0)

## 2013-10-05 LAB — HEMOGLOBIN A1C: HEMOGLOBIN A1C: 5.5 % (ref 4.6–6.5)

## 2013-10-05 LAB — TSH: TSH: 1.23 u[IU]/mL (ref 0.35–5.50)

## 2013-10-05 MED ORDER — CITALOPRAM HYDROBROMIDE 10 MG PO TABS: ORAL_TABLET | ORAL | Status: DC

## 2013-10-05 NOTE — Progress Notes (Signed)
Chief Complaint  Patient presents with  . Medication Refill    HPI:  Fu med check  Still on Citalopram 10 mg .   Per day Higher dosage caused  Lethargic and blasee.  Helps  Anxiety and panic attacks.  No attacks.   Sleep not enough . Still life long falling and staying asleep   .has tried some measures   Environmental . Exercise  One or less wine related bev per night    Asks about getting labs today had kashi bar  Snack this am has lost weightin a healthy manner over the past 2 years  Fells well otherwise   ROS: See pertinent positives and negatives per HPI. Easy bruising went away and decided against the hem consult   Tobacco 1/2ppd still chantix   Not working   wellbutrincaused insomnia.  Had deprsesive sx when tried to stop last time and had uncontrollable sobbing .  At that ime felt better staying on tobacco   Past Medical History  Diagnosis Date  . Depression   . Anxiety   . Hyperlipidemia     runs around 245  . 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 Heart failure Father     heart valve problem   . Hypertension Father   . Breast cancer Sister   . Heart failure Maternal Grandmother     History   Social History  . Marital Status: Married    Spouse Name: N/A    Number of Children: N/A  . Years of Education: N/A   Social History Main Topics  . Smoking status: Current Every Day Smoker  . Smokeless tobacco: None  . Alcohol Use: No  . Drug Use: No  . Sexual Activity:    Other Topics Concern  . None   Social History Narrative   Occupation: Research scientist (medical)   Married   Regular exercise- no   Smoker    HH of 2   5 pets  2 dogs and cats.  One outside cat.    Moved from [place] from job   G0P0    Outpatient Encounter Prescriptions as of 10/05/2013  Medication Sig  . aspirin 81 MG tablet Take 81 mg by mouth daily.    . Biotin 10 MG TABS Take by mouth.    . cholecalciferol (VITAMIN D)  1000 UNITS tablet Take 4,000 Units by mouth daily.   . citalopram (CELEXA) 10 MG tablet TAKE 1 TABLET EVERY DAY  . COD LIVER OIL PO Take by mouth.    Marland Kitchen glucosamine-chondroitin 500-400 MG tablet Take 1 tablet by mouth 3 (three) times daily.    . MULTIPLE VITAMIN PO Take by mouth.    . Probiotic Product (ALIGN) 4 MG CAPS Take by mouth.    . [DISCONTINUED] citalopram (CELEXA) 10 MG tablet TAKE 1 TABLET EVERY DAY   Wt Readings from Last 3 Encounters:  10/05/13 138 lb (62.596 kg)  01/04/13 153 lb (69.4 kg)  09/18/12 155 lb (70.308 kg)    EXAM:  BP 102/68  Pulse 67  Temp(Src) 99.2 F (37.3 C) (Oral)  Ht 5' 5.5" (1.664 m)  Wt 138 lb (62.596 kg)  BMI 22.61 kg/m2  Body mass index is 22.61 kg/(m^2).  GENERAL: vitals reviewed and listed above, alert, oriented, appears well hydrated and in no acute distress HEENT: atraumatic, conjunctiva  clear, no obvious abnormalities on inspection of external nose and earsNECK:  no obvious masses on inspection palpation  LUNGS: clear to auscultation bilaterally, no wheezes, rales or rhonchi, good air movement CV: HRRR, no clubbing cyanosis or  peripheral edema nl cap refill  MS: moves all extremities without noticeable focal  abnormality PSYCH: pleasant and cooperative, no obvious depression or anxiety  ASSESSMENT AND PLAN:  Discussed the following assessment and plan:  Anxiety state, unspecified - Plan: Basic metabolic panel, CBC with Differential, Hemoglobin A1c, Hepatic function panel, Lipid panel, TSH  Other and unspecified hyperlipidemia - Plan: Basic metabolic panel, CBC with Differential, Hemoglobin A1c, Hepatic function panel, Lipid panel, TSH  Tobacco use disorder - Plan: Basic metabolic panel, CBC with Differential, Hemoglobin A1c, Hepatic function panel, Lipid panel, TSH  Preventive measure - Plan: Basic metabolic panel, CBC with Differential, Hemoglobin A1c, Hepatic function panel, Lipid panel, TSH  Medication  management  Insomnia Disc tobacco cessationlifestyle intervention healthy eating and exercise .Will notify you  of labs when available. Lab non fasting today Check yearly  -Patient advised to return or notify health care team  if symptoms worsen ,persist or new concerns arise.  Patient Instructions  Will notify you  of labs when available. Ok to refill the celeax for now.  Try using melatonin earlier  In evening aboiut 3-4 hours before   Trial celxa in am instead of night .  Standley Brooking. Panosh M.D.  Total visit 83mins > 50% spent counseling and coordinating care

## 2013-10-05 NOTE — Progress Notes (Signed)
Pre visit review using our clinic review tool, if applicable. No additional management support is needed unless otherwise documented below in the visit note. 

## 2013-10-05 NOTE — Patient Instructions (Signed)
Will notify you  of labs when available. Ok to refill the celeax for now.  Try using melatonin earlier  In evening aboiut 3-4 hours before   Trial celxa in am instead of night .

## 2013-10-05 NOTE — Telephone Encounter (Signed)
Relevant patient education assigned to patient using Emmi. ° °

## 2013-12-27 ENCOUNTER — Encounter: Payer: Self-pay | Admitting: Internal Medicine

## 2013-12-27 ENCOUNTER — Telehealth: Payer: Self-pay | Admitting: Internal Medicine

## 2013-12-27 ENCOUNTER — Ambulatory Visit (INDEPENDENT_AMBULATORY_CARE_PROVIDER_SITE_OTHER): Payer: 59 | Admitting: Internal Medicine

## 2013-12-27 VITALS — BP 116/80 | HR 69 | Temp 99.5°F | Ht 65.5 in | Wt 137.0 lb

## 2013-12-27 DIAGNOSIS — Z299 Encounter for prophylactic measures, unspecified: Secondary | ICD-10-CM

## 2013-12-27 DIAGNOSIS — M79606 Pain in leg, unspecified: Secondary | ICD-10-CM | POA: Insufficient documentation

## 2013-12-27 DIAGNOSIS — Z1211 Encounter for screening for malignant neoplasm of colon: Secondary | ICD-10-CM

## 2013-12-27 DIAGNOSIS — Z23 Encounter for immunization: Secondary | ICD-10-CM

## 2013-12-27 DIAGNOSIS — M79609 Pain in unspecified limb: Secondary | ICD-10-CM

## 2013-12-27 DIAGNOSIS — M79605 Pain in left leg: Secondary | ICD-10-CM

## 2013-12-27 MED ORDER — NAPROXEN 500 MG PO TABS: 500.0000 mg | ORAL_TABLET | Freq: Two times a day (BID) | ORAL | Status: DC

## 2013-12-27 NOTE — Telephone Encounter (Signed)
Noted  

## 2013-12-27 NOTE — Patient Instructions (Signed)
I agree  This acts like nerve pain but    Consider ileotibial band problem and or hip problem radiation down the leg.  Begin antiinflammatory to calm the pain down . ( dont take with aspirin) Will plan a referral to sports medicine/ orthopedics . For evaluation and further treatment amanagement .

## 2013-12-27 NOTE — Telephone Encounter (Signed)
Patient Information:  Caller Name: Linsey  Phone: [phone]  Patient: Jessica Cooper  Gender: Female  DOB: [birth year]  Age: 53 Years  PCP: Shanon Ace [facility])  Pregnant: No  Office Follow Up:  Does the office need to follow up with this patient?: No  Instructions For The Office: N/A  RN Note:  Discussed ED disp/office visit with Jenny Reichmann, as Irine Seal was in a meeting. Ok to make pt an appt in office; discussed per nursing judgement and pt preference.  Symptoms  Reason For Call & Symptoms: Pt calling regarding left leg nerve pain, that travels from hip to knee, and interrupts sleep (12/20/13) and tingling in rt arm (ongoing for months, comes and goes). Leg paind has really "been around" for 11 years, but acute since 12/20/13, causing disruption in daily living. Denies any other obvious stroke sx. Pt calling specifically for leg pain; arm pain is not primary concern. Denies any recent injuries or traumas. Not otherwise ill.  Reviewed Health History In EMR: Yes  Reviewed Medications In EMR: Yes  Reviewed Allergies In EMR: Yes  Reviewed Surgeries / Procedures: Yes  Date of Onset of Symptoms: 12/15/2013  Treatments Tried: Tylenol PM at HS per package directions.  Treatments Tried Worked: Yes OB / GYN:  LMP: Unknown  Guideline(s) Used:  Leg Pain  Disposition Per Guideline:   Go to ED Now (or to Office with PCP Approval)  Reason For Disposition Reached:   Thigh or calf pain in only one leg and present > 1 hour  Advice Given:  N/A  Patient Will Follow Care Advice:  YES  Appointment Scheduled:  12/27/2013 10:45:00 Appointment Scheduled Provider:  Shanon Ace (Family Practice)

## 2013-12-27 NOTE — Progress Notes (Signed)
Pre visit review using our clinic review tool, if applicable. No additional management support is needed unless otherwise documented below in the visit note.  Chief Complaint  Patient presents with  . Left Leg Pain    Pain ongoing for many years.  Worsening in the past week with new pain in her hip.  Pain now extendes from her left hip down the outer side of her left thigh and ends at her knee.  Tylenol PM last night to help her sleep.    HPI: Patient comes in today for SDA for  Acute problem evaluation.  Apparently long term problems that is worsening onset  11 years after loising weight  And never went away felt by prev doc from tight close at the time  Continued mostly annoying but not sever difficulty  Bu tinc sx pain and numb like in last 1-2 weeks worse.  "Yelling at her and screams "at times.   Takes breath away wprse in am .  Walk certain way no falling injury or fever assoc sx   2 tylenol pm helps a little  Avoiding much beds .says has had hip sx that went away with glucosamine chondroitin .  ROS: See pertinent positives and negatives per HPI. No weakness injury other ext sx abd sx with this no weigh tloss sweats fever joint swelling  Past Medical History  Diagnosis Date  . Depression   . Anxiety   . Hyperlipidemia     runs around 245  . 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 Heart failure Father     heart valve problem   . Hypertension Father   . Breast cancer Sister   . Heart failure Maternal Grandmother     History   Social History  . Marital Status: Married    Spouse Name: N/A    Number of Children: N/A  . Years of Education: N/A   Social History Main Topics  . Smoking status: Current Every Day Smoker  . Smokeless tobacco: None  . Alcohol Use: No  . Drug Use: No  . Sexual Activity:    Other Topics Concern  . None   Social History Narrative   Occupation: Research scientist (medical)   Married     Regular exercise- no   Smoker    HH of 2   5 pets  2 dogs and cats.  One outside cat.    Moved from [place] from job   [id number]    Outpatient Encounter Prescriptions as of 12/27/2013  Medication Sig  . aspirin 81 MG tablet Take 81 mg by mouth daily.    . Biotin 10 MG TABS Take by mouth.    . cholecalciferol (VITAMIN D) 1000 UNITS tablet Take 4,000 Units by mouth daily.   . citalopram (CELEXA) 10 MG tablet TAKE 1 TABLET EVERY DAY  . COD LIVER OIL PO Take by mouth.    Marland Kitchen glucosamine-chondroitin 500-400 MG tablet Take 1 tablet by mouth 3 (three) times daily.    . MULTIPLE VITAMIN PO Take by mouth.    . Probiotic Product (ALIGN) 4 MG CAPS Take by mouth.    . naproxen (NAPROSYN) 500 MG tablet Take 1 tablet (500 mg total) by mouth 2 (two) times daily with a meal.    EXAM:  BP 116/80  Pulse 69  Temp(Src) 99.5 F (37.5 C) (Oral)  Ht 5' 5.5" (1.664 m)  Wt 137 lb (  62.143 kg)  BMI 22.44 kg/m2  SpO2 98%  Body mass index is 22.44 kg/(m^2).  GENERAL: vitals reviewed and listed above, alert, oriented, appears well hydrated and in no acute distress well groomed in heels and skirt .  HEENT: atraumatic, conjunctiva  clear, no obvious abnormalities on inspection of external nose and ears OP : no lesion edema or exudate  NECK: no obvious masses on inspection palpation  CV: HRRR, no clubbing cyanosis or  peripheral edema nl cap refill  MS: moves all extremities without noticeable focal  Abnormality points to lateral hip iliac area and lateral knee  Area but no abnormality to inspection .   Gait nl no gropin.  pain rom nl hip  Non focal neuro  Pulse present.  Motor is intact  PSYCH: pleasant and cooperative, no obvious depression or anxiety  ASSESSMENT AND PLAN:  Discussed the following assessment and plan:  Leg pain, lateral, left - ? neural parethestica vs  hip back problem  - Plan: Ambulatory referral to Sports Medicine  Need for Tdap vaccination - Plan: Tdap vaccine greater than or equal to 7yo  IM  Special screening for malignant neoplasms, colon - Plan: Ambulatory referral to Gastroenterology  Preventive measure - pt never go colosncopy ask for rereferal. 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 Advise referal to [facility] orth[facility] to evaluate dx   -Patient advised to return or notify health care team  if symptoms worsen ,persist or new concerns arise.  Patient Instructions  I agree  This acts like nerve pain but    Consider ileotibial band problem and or hip problem radiation down the leg.  Begin antiinflammatory to calm the pain down . ( dont take with aspirin) Will plan a referral to sports medicine/ orthopedics . For evaluation and further treatment amanagement .     Standley Brooking. Panosh M.D.

## 2013-12-29 ENCOUNTER — Ambulatory Visit (INDEPENDENT_AMBULATORY_CARE_PROVIDER_SITE_OTHER): Payer: 59 | Admitting: Family Medicine

## 2013-12-29 ENCOUNTER — Encounter: Payer: Self-pay | Admitting: Family Medicine

## 2013-12-29 VITALS — BP 112/80 | HR 63 | Ht 65.5 in | Wt 138.0 lb

## 2013-12-29 DIAGNOSIS — M629 Disorder of muscle, unspecified: Secondary | ICD-10-CM

## 2013-12-29 DIAGNOSIS — M7632 Iliotibial band syndrome, left leg: Secondary | ICD-10-CM | POA: Insufficient documentation

## 2013-12-29 MED ORDER — DICLOFENAC SODIUM 2 % TD SOLN: TRANSDERMAL | Status: DC

## 2013-12-29 NOTE — Assessment & Plan Note (Addendum)
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 Patient has not tried any exercises for this and was given a handout. Patient showed proper positioning in proper exercises as well. We discussed an icing protocol. We also discussed topical anti-inflammatories avoiding any oral anti-inflammatories. We discussed over-the-counter medications he can be beneficial. We discussed how patient's shoes checking this. Patient given a handout today of other treatment options. Patient has no signs of back pain or any radicular symptoms I think could be contributing. Differential does include cutaneous femoral nerve impingement but I think this is less likely.  I reviewed with the patient the anatomy involved with ITB syndrome.  Given rehab program  - primarily ITB stretching program, core stability program, gluteus medius strengthening, hip flexion strengthening, and core. Additional proprioception and mild plyometrics program reviewed.  Reviewed plan of care and rehab is the primary treatment in this condition. Patient will try these interventions and come back again in 3 weeks. At continuing to have pain I would consider ultrasound and questionable injection if necessary.

## 2013-12-29 NOTE — Patient Instructions (Addendum)
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  Try the topical medicine 2 times daily until gone.  Ice 20 minutes after activity and before bed.  Hip abductor on wall.  Heel and butt touching.  Raise leg 6 inches, hold 2 seconds down slow for count of 4 seconds.  30 reps daily each side  Exercises in handout 3 times a week.  Turmeric 500mg  twice daily.  Spenco orthotics and you can get them online or [employer].  Come back in 3-4 weeks, and if not perfect we will ultrasound and see what going on.

## 2013-12-29 NOTE — Progress Notes (Signed)
  [facility] [address] [place], [place] [address] Phone: [phone] Subjective:    I'm seeing this patient by the request  of:  Lottie Dawson, MD   CC: Left leg pain  [id number] Jessica Cooper is a 53 y.o. female coming in with complaint of left leg pain. Patient states that she has had this pain for 11 years intermittently. Patient states it is never stopped her from any activity but now unfortunately he started to give her more trouble. Patient states it is more on the lateral aspect of her leg. Patient feels it is more now constant dull aching pain that can be worse with certain activities such as going from a seated to standing position or walking for long amount of time. Patient states it usually starts near the hip in internal radiates down the leg. Patient describes it as a burning sensation and has a toothache like pain. Patient denies any weakness, denies any back pain, denies any fevers or chills or any abnormal weight loss. Patient has tried over-the-counter medications as well as a prescription medication with minimal benefit. Patient is concerned because it is starting to affect her daily activities and can wake her up at night. Patient puts the severity of 8/10     Past medical history, social, surgical and family history all reviewed in electronic medical record.   Review of Systems: No headache, visual changes, nausea, vomiting, diarrhea, constipation, dizziness, abdominal pain, skin rash, fevers, chills, night sweats, weight loss, swollen lymph nodes, body aches, joint swelling, muscle aches, chest pain, shortness of breath, mood changes.   Objective Blood pressure 112/80, pulse 63, height 5' 5.5" (1.664 m), weight 138 lb (62.596 kg), SpO2 95.00%.  General: No apparent distress alert and oriented x3 mood and affect normal, dressed appropriately.  HEENT: Pupils equal, extraocular movements intact  Respiratory: Patient's speak in full  sentences and does not appear short of breath  Cardiovascular: No lower extremity edema, non tender, no erythema  Skin: Warm dry intact with no signs of infection or rash on extremities or on axial skeleton.  Abdomen: Soft nontender  Neuro: Cranial nerves II through XII are intact, neurovascularly intact in all extremities with 2+ DTRs and 2+ pulses.  Lymph: No lymphadenopathy of posterior or anterior cervical chain or axillae bilaterally.  Gait normal with good balance and coordination.  MSK:  Non tender with full range of motion and good stability and symmetric strength and tone of shoulders, elbows, wrist,  knee and ankles bilaterally.  Hip: Left ROM IR: 25 Deg, ER: 35 Deg, Flexion: 120 Deg, Extension: 100 Deg, Abduction: 45 Deg, Adduction: 45 Deg Strength IR: 5/5, ER: 5/5, Flexion: 5/5, Extension: 5/5, Abduction: 3/5, Adduction: 4/5 Pelvic alignment unremarkable to inspection and palpation. Standing hip rotation and gait without trendelenburg sign / unsteadiness. Greater trochanter with tenderness to palpation. Patient is also tender to palpation over the distal ITB No tenderness over piriformis  Positive Faber No SI joint tenderness and normal minimal SI movement.    Impression and Recommendations:     This case required medical decision making of moderate complexity.

## 2014-01-03 ENCOUNTER — Encounter: Payer: Self-pay | Admitting: Internal Medicine

## 2014-01-05 ENCOUNTER — Ambulatory Visit: Payer: 59 | Admitting: Internal Medicine

## 2014-01-14 ENCOUNTER — Ambulatory Visit: Payer: 59 | Admitting: Family Medicine

## 2014-01-25 ENCOUNTER — Ambulatory Visit: Payer: 59 | Admitting: Family Medicine

## 2014-02-03 ENCOUNTER — Telehealth: Payer: Self-pay | Admitting: Family Medicine

## 2014-02-03 NOTE — Telephone Encounter (Signed)
Been over a month since seen patient I would need to see her.

## 2014-02-03 NOTE — Telephone Encounter (Signed)
appt is set.  °

## 2014-02-03 NOTE — Telephone Encounter (Signed)
Pt stated that she is trying to do the exercise for her IT band issue but she is in a lot of pain. Pt was wondering what else can she do, or can Dr. Tamala Julian give her something that will not effect her brain or make her sleepy. Please advise. Celll [phone]

## 2014-02-04 ENCOUNTER — Ambulatory Visit (INDEPENDENT_AMBULATORY_CARE_PROVIDER_SITE_OTHER): Payer: 59 | Admitting: Family Medicine

## 2014-02-04 VITALS — BP 118/74 | HR 63 | Ht 65.75 in | Wt 141.0 lb

## 2014-02-04 DIAGNOSIS — M7632 Iliotibial band syndrome, left leg: Secondary | ICD-10-CM

## 2014-02-04 DIAGNOSIS — M629 Disorder of muscle, unspecified: Secondary | ICD-10-CM

## 2014-02-04 MED ORDER — TRAMADOL HCL 50 MG PO TABS: 50.0000 mg | ORAL_TABLET | Freq: Every evening | ORAL | Status: DC | PRN

## 2014-02-04 MED ORDER — PREDNISONE 50 MG PO TABS: 50.0000 mg | ORAL_TABLET | Freq: Every day | ORAL | Status: DC

## 2014-02-04 NOTE — Patient Instructions (Signed)
Good to see you Exercises on wall would be great PT will be calling you Prednisone daily for 5 days. Tramadol at night.   Ice is your friend.  Come back in 3-4 weeks.

## 2014-02-06 ENCOUNTER — Encounter: Payer: Self-pay | Admitting: Family Medicine

## 2014-02-06 NOTE — Assessment & Plan Note (Signed)
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 Patient is having difficulty doing the exercises on her own so I do feel physical therapy is warranted. Referral placed. Discuss with thickened her in the topical anti-inflammatories. In addition we'll do ten-day burst of prednisone to see if this is beneficial. Patient was given a prescription for tramadol to take at night if necessary due to nighttime awakening. We've discussed the importance of home exercises and icing, great detail again today. Patient does not make any significant improvement she'll come back and see me again in 3-4 weeks. At that time if continued pain we need to get further imaging. Spent greater than 25 minutes with patient face-to-face and had greater than 50% of counseling including as described above in assessment and plan.

## 2014-02-06 NOTE — Progress Notes (Signed)
  [facility] [address] [place], [place] [address] Phone: [phone] Subjective:     CC: Left leg pain  [id number] Jessica Cooper is a 53 y.o. female coming in with complaint of left leg pain. 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 Patient was to do conservative therapy including exercises, icing, topical anti-inflammatories. States that she has been noncompliant. Patient also states that the pain seems to be getting worse overall. States that walking sometimes she noticed a sensation that her hip causes her to feel that she will lose her balance. Patient states it still radiates down the lateral aspect of her leg and stops her knee. Denies any significant weakness. States that she is finding it hard to be motivated to do the exercises herself. Patient states now it is starting to affect date activities.     Past medical history, social, surgical and family history all reviewed in electronic medical record.   Review of Systems: No headache, visual changes, nausea, vomiting, diarrhea, constipation, dizziness, abdominal pain, skin rash, fevers, chills, night sweats, weight loss, swollen lymph nodes, body aches, joint swelling, muscle aches, chest pain, shortness of breath, mood changes.   Objective Blood pressure 118/74, pulse 63, height 5' 5.75" (1.67 m), weight 141 lb (63.957 kg), SpO2 98.00%.  General: No apparent distress alert and oriented x3 mood and affect normal, dressed appropriately.  HEENT: Pupils equal, extraocular movements intact  Respiratory: Patient's speak in full sentences and does not appear short of breath  Cardiovascular: No lower extremity edema, non tender, no erythema  Skin: Warm dry intact with no signs of infection or rash on extremities or on axial skeleton.  Abdomen: Soft nontender  Neuro: Cranial nerves II through XII are intact, neurovascularly intact in all extremities with 2+ DTRs  and 2+ pulses.  Lymph: No lymphadenopathy of posterior or anterior cervical chain or axillae bilaterally.  Gait normal with good balance and coordination.  MSK:  Non tender with full range of motion and good stability and symmetric strength and tone of shoulders, elbows, wrist,  knee and ankles bilaterally.  Hip: Left ROM IR: 25 Deg, ER: 35 Deg, Flexion: 120 Deg, Extension: 100 Deg, Abduction: 45 Deg, Adduction: 45 Deg Strength IR: 5/5, ER: 5/5, Flexion: 5/5, Extension: 5/5, Abduction: 3/5, Adduction: 4/5 Pelvic alignment unremarkable to inspection and palpation. Standing hip rotation and gait without trendelenburg sign / unsteadiness. Greater trochanter with tenderness to palpation. Patient is also tender to palpation over the distal ITB new findings with exam shows severe tenderness over the tensor fascia lata No tenderness over piriformis  Positive Faber No SI joint tenderness and normal minimal SI movement. Contralateral hip unremarkable    Impression and Recommendations:     This case required medical decision making of moderate complexity.

## 2014-02-07 ENCOUNTER — Telehealth: Payer: Self-pay | Admitting: Family Medicine

## 2014-02-07 MED ORDER — PREDNISONE 50 MG PO TABS: 50.0000 mg | ORAL_TABLET | Freq: Every day | ORAL | Status: DC

## 2014-02-07 NOTE — Telephone Encounter (Signed)
Patient is calling in regards to her medications that were to be called in on Friday. She says she uses [facility] on [address] in [place], [place]. She asks that her prescriptions be sent to this pharmacy.

## 2014-02-07 NOTE — Telephone Encounter (Signed)
rx sent to pharmacy

## 2014-02-14 ENCOUNTER — Ambulatory Visit: Payer: 59 | Attending: Family Medicine | Admitting: Rehabilitation

## 2014-02-14 DIAGNOSIS — M25559 Pain in unspecified hip: Secondary | ICD-10-CM | POA: Diagnosis not present

## 2014-02-14 DIAGNOSIS — IMO0001 Reserved for inherently not codable concepts without codable children: Secondary | ICD-10-CM | POA: Diagnosis present

## 2014-02-16 ENCOUNTER — Ambulatory Visit: Payer: 59 | Admitting: Rehabilitation

## 2014-02-16 DIAGNOSIS — IMO0001 Reserved for inherently not codable concepts without codable children: Secondary | ICD-10-CM | POA: Diagnosis not present

## 2014-02-17 ENCOUNTER — Encounter: Payer: 59 | Admitting: Rehabilitation

## 2014-02-18 ENCOUNTER — Telehealth: Payer: Self-pay | Admitting: Internal Medicine

## 2014-02-18 NOTE — Telephone Encounter (Signed)
Pt would like to know if Dr. Regis Bill will accept her mother as a new pt. Mother has Clinical cytogeneticist. States mother just moved to [place] from another state and she would like for her mom to see Dr. Regis Bill as well.

## 2014-02-21 ENCOUNTER — Encounter: Payer: 59 | Admitting: Rehabilitation

## 2014-02-22 ENCOUNTER — Ambulatory Visit: Payer: 59 | Attending: Family Medicine | Admitting: Rehabilitation

## 2014-02-22 DIAGNOSIS — M25559 Pain in unspecified hip: Secondary | ICD-10-CM | POA: Insufficient documentation

## 2014-02-22 DIAGNOSIS — IMO0001 Reserved for inherently not codable concepts without codable children: Secondary | ICD-10-CM | POA: Insufficient documentation

## 2014-02-24 ENCOUNTER — Ambulatory Visit: Payer: 59 | Admitting: Rehabilitation

## 2014-02-24 DIAGNOSIS — IMO0001 Reserved for inherently not codable concepts without codable children: Secondary | ICD-10-CM | POA: Diagnosis not present

## 2014-03-01 ENCOUNTER — Ambulatory Visit: Payer: 59 | Admitting: Rehabilitation

## 2014-03-01 DIAGNOSIS — IMO0001 Reserved for inherently not codable concepts without codable children: Secondary | ICD-10-CM | POA: Diagnosis not present

## 2014-03-02 ENCOUNTER — Ambulatory Visit (AMBULATORY_SURGERY_CENTER): Payer: Self-pay

## 2014-03-02 VITALS — Ht 65.25 in | Wt 135.6 lb

## 2014-03-02 DIAGNOSIS — Z1211 Encounter for screening for malignant neoplasm of colon: Secondary | ICD-10-CM

## 2014-03-02 MED ORDER — MOVIPREP 100 G PO SOLR: 1.0000 | Freq: Once | ORAL | Status: DC

## 2014-03-02 NOTE — Progress Notes (Signed)
No allergies to eggs or soy No past problems with anesthesia except excessive drowsiness (unsure if this was general anesthesia or not) No home oxygen No diet/weight loss meds  Has email  Emmi instructions given for colonoscopy

## 2014-03-03 ENCOUNTER — Ambulatory Visit: Payer: 59 | Admitting: Rehabilitation

## 2014-03-03 DIAGNOSIS — IMO0001 Reserved for inherently not codable concepts without codable children: Secondary | ICD-10-CM | POA: Diagnosis not present

## 2014-03-07 ENCOUNTER — Telehealth: Payer: Self-pay | Admitting: Family Medicine

## 2014-03-07 ENCOUNTER — Ambulatory Visit: Payer: 59 | Admitting: Physical Therapy

## 2014-03-07 DIAGNOSIS — IMO0001 Reserved for inherently not codable concepts without codable children: Secondary | ICD-10-CM | POA: Diagnosis not present

## 2014-03-07 NOTE — Telephone Encounter (Signed)
Is requesting referral to be signed and faxed back in.

## 2014-03-08 NOTE — Telephone Encounter (Signed)
Signed.faxed

## 2014-03-09 ENCOUNTER — Ambulatory Visit (INDEPENDENT_AMBULATORY_CARE_PROVIDER_SITE_OTHER): Payer: 59 | Admitting: Family Medicine

## 2014-03-09 ENCOUNTER — Encounter: Payer: Self-pay | Admitting: Family Medicine

## 2014-03-09 VITALS — BP 112/72 | HR 63 | Ht 65.75 in | Wt 135.0 lb

## 2014-03-09 DIAGNOSIS — M629 Disorder of muscle, unspecified: Secondary | ICD-10-CM

## 2014-03-09 DIAGNOSIS — M2420 Disorder of ligament, unspecified site: Secondary | ICD-10-CM

## 2014-03-09 DIAGNOSIS — M7632 Iliotibial band syndrome, left leg: Secondary | ICD-10-CM

## 2014-03-09 MED ORDER — DULOXETINE HCL 20 MG PO CPEP: 20.0000 mg | ORAL_CAPSULE | Freq: Every day | ORAL | Status: DC

## 2014-03-09 NOTE — Progress Notes (Signed)
  [facility] [address] [place], [place] [address] Phone: [phone] Subjective:     CC: Left leg pain followup  [id number] Jessica Cooper is a 53 y.o. female coming in with complaint of left leg pain. Patient has a history of severe iliotibial band syndrome. Patient has though been working with physical therapy. Patient also has snapping hip syndrome and overall is doing significantly better she states. Still has a dull throbbing aching pain in his anterior thigh on the left side but not as much in the numbness that she was having previously. Patient states that it is not occurring as frequently but still is occurring daily. Patient denies as much as the nighttime pain as well. Patient is not using the topical medicine she did not think it made any significant improvement. Patient has been doing some of the exercises.    Past medical history, social, surgical and family history all reviewed in electronic medical record.   Review of Systems: No headache, visual changes, nausea, vomiting, diarrhea, constipation, dizziness, abdominal pain, skin rash, fevers, chills, night sweats, weight loss, swollen lymph nodes, body aches, joint swelling, muscle aches, chest pain, shortness of breath, mood changes.   Objective Blood pressure 112/72, pulse 63, height 5' 5.75" (1.67 m), weight 135 lb (61.236 kg), SpO2 98.00%.  General: No apparent distress alert and oriented x3 mood and affect normal, dressed appropriately.  HEENT: Pupils equal, extraocular movements intact  Respiratory: Patient's speak in full sentences and does not appear short of breath  Cardiovascular: No lower extremity edema, non tender, no erythema  Skin: Warm dry intact with no signs of infection or rash on extremities or on axial skeleton.  Abdomen: Soft nontender  Neuro: Cranial nerves II through XII are intact, neurovascularly intact in all extremities with 2+ DTRs and 2+ pulses.  Lymph: No  lymphadenopathy of posterior or anterior cervical chain or axillae bilaterally.  Gait normal with good balance and coordination.  MSK:  Non tender with full range of motion and good stability and symmetric strength and tone of shoulders, elbows, wrist,  knee and ankles bilaterally.  Hip: Left ROM IR: 25 Deg, ER: 35 Deg, Flexion: 120 Deg, Extension: 100 Deg, Abduction: 45 Deg, Adduction: 45 Deg Strength IR: 5/5, ER: 5/5, Flexion: 5/5, Extension: 5/5, Abduction: 3/5, Adduction: 4/5 Pelvic alignment unremarkable to inspection and palpation. Standing hip rotation and gait without trendelenburg sign / unsteadiness. Greater trochanter with tenderness to palpation. Patient is also tender to palpation over the distal ITB which is improved s Not as tender over the tensor fascia lata as previously as well. No tenderness over piriformis  Positive Faber No SI joint tenderness and normal minimal SI movement. Contralateral hip unremarkable    Impression and Recommendations:     This case required medical decision making of moderate complexity.

## 2014-03-09 NOTE — Patient Instructions (Signed)
You are doing great!!! Lets try cymbalta.   Take 1 pill daily for next 2 weeks then increase to 2 pills In 2 weeks stop the celexa.  conitnue the exercises on your own.  Exercises on wall.  Heel and butt touching.  Raise leg 6 inches and hold 2 seconds.  Down slow for count of 4 seconds.  1 set of 30 reps daily on both sides.  Come back in 4 weeks and if not perfect we will do MRI

## 2014-03-09 NOTE — Assessment & Plan Note (Signed)
Patient has made some improvement. I did discuss with patient about the possibility of icing as well as continuing home exercises on a regular basis. I did discuss that the differential also includes reflex and pathetic dystrophy for her pain. Do not think this is likely radicular symptoms but if he continues an MRI of her back or hip probably is going to be necessary. This will rule out any other pathologic condition. Strengthening exercises were given today as well. I believe the patient can respond well to Cymbalta and was given a prescription today. Patient is going to try this and titrate up slowly while she decreases her Celexa. Patient will do this and come back again and see me in 4 weeks for further evaluation.  Spent greater than 25 minutes with patient face-to-face and had greater than 50% of counseling including as described above in assessment and plan.

## 2014-03-10 ENCOUNTER — Ambulatory Visit: Payer: 59 | Admitting: Rehabilitation

## 2014-03-10 DIAGNOSIS — IMO0001 Reserved for inherently not codable concepts without codable children: Secondary | ICD-10-CM | POA: Diagnosis not present

## 2014-03-11 ENCOUNTER — Encounter: Payer: 59 | Admitting: Internal Medicine

## 2014-04-11 ENCOUNTER — Ambulatory Visit: Payer: 59 | Admitting: Family Medicine

## 2014-07-14 ENCOUNTER — Telehealth: Payer: Self-pay | Admitting: Internal Medicine

## 2014-07-18 NOTE — Telephone Encounter (Signed)
PLEASE NOTE: All timestamps contained within this report are represented as [place] Standard Time. CONFIDENTIALTY NOTICE: This fax transmission is intended only for the addressee. It contains information that is legally privileged, confidential or otherwise protected from use or disclosure. If you are not the intended recipient, you are strictly prohibited from reviewing, disclosing, copying using or disseminating any of this information or taking any action in reliance on or regarding this information. If you have received this fax in error, please notify us immediately by telephone so that we can arrange for its return to [place]. Phone: [phone], Toll-Free: [phone], Fax: [phone] Page: 1 of 2 Call Id: [id number] [facility] - Client [place] Patient Name: Jessica Cooper Gender: Female DOB: 06/17/1961 Age: 54 Y 58 M Return Phone Number: [phone] (Primary), [phone] (Secondary) Address: [address] City/State/Zip: [place]  [address] Client [facility] - Client Client Site [facility] - Night Physician Shanon Ace Contact Type Call Call Type Triage / Clinical Relationship To Patient Self Return Phone Number [phone] (Primary) Chief Complaint Earache Initial Comment Caller states she is having an issue with her ear. She is having an ear ache. PreDisposition Home Care Nurse Assessment Nurse: Dimas Chyle, RN, Levada Dy Date/Time Eilene Ghazi Time): 07/14/2014 9:38:08 PM Confirm and document reason for call. If symptomatic, describe symptoms. ---Caller states she is having an issue with her ear. She is having an ear ache. Started on Sunday with head congestion with drainage and sore throat. Tonight her left ear started "plugging up" and having pain. Has the patient traveled out of the country within the last 30 days? ---No Does the patient require triage?  ---Yes Related visit to physician within the last 2 weeks? ---No Does the PT have any chronic conditions? (i.e. diabetes, asthma, etc.) ---No Did the patient indicate they were pregnant? ---No Guidelines Guideline Title Affirmed Question Affirmed Notes Nurse Date/Time (Eastern Time) Earache Earache < 60 minutes duration that is now completely gone [place], Environmental consultant 07/14/2014 9:39:24 PM Disp. Time Eilene Ghazi Time) Disposition Final User 07/14/2014 9:40:32 PM Home Care Yes Dimas Chyle, RN, Marin Shutter Understands: Yes Disagree/Comply: Comply PLEASE NOTE: All timestamps contained within this report are represented as [place] Standard Time. CONFIDENTIALTY NOTICE: This fax transmission is intended only for the addressee. It contains information that is legally privileged, confidential or otherwise protected from use or disclosure. If you are not the intended recipient, you are strictly prohibited from reviewing, disclosing, copying using or disseminating any of this information or taking any action in reliance on or regarding this information. If you have received this fax in error, please notify us immediately by telephone so that we can arrange for its return to [place]. Phone: [phone], Toll-Free: [phone], Fax: [phone] Page: 2 of 2 Call Id: [id number] Care Advice Given Per Guideline HOME CARE: You should be able to treat this at home. REASSURANCE - RECENT- ONSET EARACHE ( NO MEDS: Don't give pain medicines for now, since no pain. CALL BACK IF: * Earache persists over 1 hour * High fever, severe headache, or stiff neck occurs * You become worse. CARE ADVICE given per Earache (Adult) guideline. After Care Instructions Given Call Event Type User Date / Time Description

## 2014-10-25 ENCOUNTER — Telehealth: Payer: Self-pay | Admitting: Family Medicine

## 2014-10-25 ENCOUNTER — Other Ambulatory Visit: Payer: Self-pay | Admitting: Internal Medicine

## 2014-10-25 ENCOUNTER — Other Ambulatory Visit: Payer: Self-pay | Admitting: Family Medicine

## 2014-10-25 DIAGNOSIS — Z0000 Encounter for general adult medical examination without abnormal findings: Secondary | ICD-10-CM

## 2014-10-25 NOTE — Telephone Encounter (Signed)
Sent to the pharmacy by e-scribe.  Pt is now past due for her CPX.  Sent a message to scheduling.

## 2014-10-25 NOTE — Telephone Encounter (Signed)
This patient is now past due for her CPX and lab work.  I have placed the lab orders.  Please help the pt to make both appointments.  Thanks!

## 2014-10-25 NOTE — Telephone Encounter (Signed)
lmom for pt to cb

## 2014-10-27 NOTE — Telephone Encounter (Signed)
Pt has been sch

## 2014-11-10 ENCOUNTER — Telehealth: Payer: Self-pay | Admitting: Internal Medicine

## 2014-11-10 NOTE — Telephone Encounter (Signed)
Pt request refill of the following: citalopram (CELEXA) 10 MG tablet  I see that this rx was sent on 10/25/14 but the pt said the pharmacy said they never received the request    Phamacy: [facility]

## 2014-11-10 NOTE — Telephone Encounter (Signed)
Called and spoke to the pharmacy.  Was told that her prescription was filled today and pt may pick it up at the front desk.  Left a message to inform the pt.

## 2014-12-06 ENCOUNTER — Other Ambulatory Visit (INDEPENDENT_AMBULATORY_CARE_PROVIDER_SITE_OTHER): Payer: Self-pay

## 2014-12-06 DIAGNOSIS — Z0000 Encounter for general adult medical examination without abnormal findings: Secondary | ICD-10-CM

## 2014-12-06 LAB — CBC WITH DIFFERENTIAL/PLATELET
Basophils Absolute: 0.1 10*3/uL (ref 0.0–0.1)
Basophils Relative: 0.6 % (ref 0.0–3.0)
Eosinophils Absolute: 0.2 10*3/uL (ref 0.0–0.7)
Eosinophils Relative: 2.1 % (ref 0.0–5.0)
HEMATOCRIT: 43.3 % (ref 36.0–46.0)
Hemoglobin: 14.7 g/dL (ref 12.0–15.0)
Lymphocytes Relative: 20 % (ref 12.0–46.0)
Lymphs Abs: 1.8 10*3/uL (ref 0.7–4.0)
MCHC: 33.9 g/dL (ref 30.0–36.0)
MCV: 103.8 fl — AB (ref 78.0–100.0)
Monocytes Absolute: 0.5 10*3/uL (ref 0.1–1.0)
Monocytes Relative: 5.4 % (ref 3.0–12.0)
Neutro Abs: 6.3 10*3/uL (ref 1.4–7.7)
Neutrophils Relative %: 71.9 % (ref 43.0–77.0)
Platelets: 183 10*3/uL (ref 150.0–400.0)
RBC: 4.17 Mil/uL (ref 3.87–5.11)
RDW: 13.4 % (ref 11.5–15.5)
WBC: 8.8 10*3/uL (ref 4.0–10.5)

## 2014-12-06 LAB — LIPID PANEL
Cholesterol: 227 mg/dL — ABNORMAL HIGH (ref 0–200)
HDL: 72.7 mg/dL (ref >39.00)
LDL CALC: 136 mg/dL — AB (ref 0–99)
NONHDL: 154.3
Total CHOL/HDL Ratio: 3
Triglycerides: 94 mg/dL (ref 0.0–149.0)
VLDL: 18.8 mg/dL (ref 0.0–40.0)

## 2014-12-06 LAB — BASIC METABOLIC PANEL
BUN: 11 mg/dL (ref 6–23)
CALCIUM: 9.9 mg/dL (ref 8.4–10.5)
CO2: 28 meq/L (ref 19–32)
Chloride: 107 mEq/L (ref 96–112)
Creatinine, Ser: 0.79 mg/dL (ref 0.40–1.20)
GFR: 81.23 mL/min (ref >60.00)
GLUCOSE: 93 mg/dL (ref 70–99)
POTASSIUM: 4.7 meq/L (ref 3.5–5.1)
Sodium: 137 mEq/L (ref 135–145)

## 2014-12-06 LAB — HEPATIC FUNCTION PANEL
ALBUMIN: 4.3 g/dL (ref 3.5–5.2)
ALK PHOS: 55 U/L (ref 39–117)
ALT: 14 U/L (ref 0–35)
AST: 17 U/L (ref 0–37)
Bilirubin, Direct: 0.1 mg/dL (ref 0.0–0.3)
Total Bilirubin: 0.7 mg/dL (ref 0.2–1.2)
Total Protein: 6.6 g/dL (ref 6.0–8.3)

## 2014-12-06 LAB — HEMOGLOBIN A1C: Hgb A1c MFr Bld: 5.1 % (ref 4.6–6.5)

## 2014-12-06 LAB — TSH: TSH: 1.84 u[IU]/mL (ref 0.35–4.50)

## 2014-12-13 ENCOUNTER — Encounter: Payer: Self-pay | Admitting: Internal Medicine

## 2014-12-13 ENCOUNTER — Ambulatory Visit (INDEPENDENT_AMBULATORY_CARE_PROVIDER_SITE_OTHER): Payer: 59 | Admitting: Internal Medicine

## 2014-12-13 VITALS — BP 122/80 | Temp 98.6°F | Ht 65.25 in | Wt 137.9 lb

## 2014-12-13 DIAGNOSIS — Z0000 Encounter for general adult medical examination without abnormal findings: Secondary | ICD-10-CM

## 2014-12-13 DIAGNOSIS — Z1211 Encounter for screening for malignant neoplasm of colon: Secondary | ICD-10-CM

## 2014-12-13 DIAGNOSIS — Z79899 Other long term (current) drug therapy: Secondary | ICD-10-CM

## 2014-12-13 DIAGNOSIS — D7589 Other specified diseases of blood and blood-forming organs: Secondary | ICD-10-CM

## 2014-12-13 DIAGNOSIS — Z720 Tobacco use: Secondary | ICD-10-CM

## 2014-12-13 DIAGNOSIS — F17200 Nicotine dependence, unspecified, uncomplicated: Secondary | ICD-10-CM

## 2014-12-13 NOTE — Progress Notes (Signed)
Pre visit review using our clinic review tool, if applicable. No additional management support is needed unless otherwise documented below in the visit note.  Chief Complaint  Patient presents with  . Annual Exam    HPI: Patient  Jessica Cooper  54 y.o. comes in today for Preventive Health Care visit   Tobacco 1 ppd  Not stopped   Hidradenitis     Comes nad goes has derm  Left  ileotibial.Band  Still a problem not as severe did exercises  Sleep always an issue   Plays some computor  games before bed  Stopping etoh makes no difference  Mood celexa helps  Inc dose causes apathy  Health Maintenance  Topic Date Due  . COLONOSCOPY  12/13/2012  . INFLUENZA VACCINE  01/23/2015  . MAMMOGRAM  06/25/2015  . PAP SMEAR  06/24/2016  . TETANUS/TDAP  12/28/2023  . HIV Screening  Completed   Health Maintenance Review LIFESTYLE:  Exercise:  Walking  Could do more  Tobacco/ETS: 1 ppd  Chantix.    Insomnia   Alcohol: per day  1.5 per night . Sugar beverages:   starbucks per day .  Sleep:   ? Hours of sleep  bad sleep . ocass snore.  Drug use: no Colonoscopy:   Not done yet PAP: per gyne  Had hyst MAMMO:  Jan 2016   ROS:  GEN/ HEENT: No fever, significant weight changes sweats headaches vision problems hearing changes, CV/ PULM; No chest pain shortness of breath cough, syncope,edema  change in exercise tolerance. GI /GU: No adominal pain, vomiting, change in bowel habits. No blood in the stool. No significant GU symptoms. SKIN/HEME: ,no acute skin rashes suspicious lesions or bleeding. No lymphadenopathy, nodules, masses.  NEURO/ PSYCH:  No neurologic signs such as weakness numbness. No depression anxiety. IMM/ Allergy: No unusual infections.  Allergy .   REST of 12 system review negative except as per HPI   Past Medical History  Diagnosis Date  . Depression   . Anxiety   . Hyperlipidemia     runs around 245  . 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 Cervical cone biopsy    . Abdominal hysterectomy  08/2008    fibroid bleeding  . Breast biopsy      Aug 2015-"tracker' placed for hyperplasia cells    Family History  Problem Relation Age of Onset  . Heart failure Father     heart valve problem   . Hypertension Father   . Breast cancer Sister   . Crohn's disease Sister   . Heart failure Maternal Grandmother   . Colon cancer Neg Hx     History   Social History  . Marital Status: Married    Spouse Name: N/A  . Number of Children: N/A  . Years of Education: N/A   Social History Main Topics  . Smoking status: Current Every Day Smoker  . Smokeless tobacco: Never Used  . Alcohol Use: 4.2 oz/week    7 Glasses of wine per week  . Drug Use: No  . Sexual Activity: Not on file   Other Topics Concern  . None   Social History Narrative   Occupation: Research scientist (medical)   Married   Regular exercise- no   Smoker    HH of 2   5  pets  2 dogs and cats.  One outside cat.    Moved from [place] from job   G0P0    Outpatient Prescriptions Prior to Visit  Medication Sig Dispense Refill  . aspirin 81 MG tablet Take 81 mg by mouth daily.      . Biotin 10 MG TABS Take by mouth.      . cholecalciferol (VITAMIN D) 1000 UNITS tablet Take 4,000 Units by mouth daily.     . citalopram (CELEXA) 10 MG tablet TAKE 1 TABLET EVERY DAY 90 tablet 0  . COD LIVER OIL PO Take by mouth.      Marland Kitchen glucosamine-chondroitin 500-400 MG tablet Take 1 tablet by mouth 3 (three) times daily.      . MULTIPLE VITAMIN PO Take by mouth.      . Probiotic Product (ALIGN) 4 MG CAPS Take by mouth.      Marland Kitchen MOVIPREP 100 G SOLR Take 1 kit (200 g total) by mouth once. (Patient not taking: Reported on 12/13/2014) 1 kit 0  . DULoxetine (CYMBALTA) 20 MG capsule Take 1 capsule (20 mg total) by mouth daily. 30 capsule 3  . traMADol (ULTRAM) 50 MG tablet Take 1  tablet (50 mg total) by mouth at bedtime as needed. 30 tablet 0   No facility-administered medications prior to visit.     EXAM:  BP 122/80 mmHg  Temp(Src) 98.6 F (37 C) (Oral)  Ht 5' 5.25" (1.657 m)  Wt 137 lb 14.4 oz (62.551 kg)  BMI 22.78 kg/m2  Body mass index is 22.78 kg/(m^2).  Physical Exam: Vital signs reviewed ENI:DPOE is a well-developed well-nourished alert cooperative    who appearsr stated age in no acute distress.  HEENT: normocephalic atraumatic , Eyes: PERRL EOM's full, conjunctiva clear, Nares: paten,t no deformity discharge or tenderness., Ears: no deformity EAC's clear TMs with normal landmarks. Mouth: clear OP, no lesions, edema.  Moist mucous membranes. Dentition in adequate repair. NECK: supple without masses, thyromegaly or bruits. CHEST/PULM:  Clear to auscultation and percussion breath sounds equal no wheeze , rales or rhonchi. No chest wall deformities or tenderness. CV: PMI is nondisplaced, S1 S2 no gallops, murmurs, rubs. Peripheral pulses are full without delay.No JVD . Breast: normal by inspection . No dimpling, discharge, masses, tenderness or discharge . ABDOMEN: Bowel sounds normal nontender  No guard or rebound, no hepato splenomegal no CVA tenderness.  No hernia. Extremtities:  No clubbing cyanosis or edema, no acute joint swelling or redness no focal atrophy NEURO:  Oriented x3, cranial nerves 3-12 appear to be intact, no obvious focal weakness,gait within normal limits no abnormal reflexes or asymmetrical SKIN: No acute rashes normal turgor, color, no bruising or petechiae. PSYCH: Oriented, good eye contact, no obvious depression anxiety, cognition and judgment appear normal. LN: no cervical axillary inguinal adenopathy  Lab Results  Component Value Date   WBC 8.8 12/06/2014   HGB 14.7 12/06/2014   HCT 43.3 12/06/2014   PLT 183.0 12/06/2014   GLUCOSE 93 12/06/2014   CHOL 227* 12/06/2014   TRIG 94.0 12/06/2014   HDL 72.70 12/06/2014    LDLDIRECT 178.9 09/18/2012   LDLCALC 136* 12/06/2014   ALT 14 12/06/2014   AST 17 12/06/2014   NA 137 12/06/2014   K 4.7 12/06/2014   CL 107 12/06/2014   CREATININE 0.79 12/06/2014   BUN 11 12/06/2014   CO2 28 12/06/2014   TSH 1.84 12/06/2014   INR 1.0 01/04/2013   HGBA1C 5.1 12/06/2014    ASSESSMENT  AND PLAN:  Discussed the following assessment and plan:  Visit for preventive health examination  Medication management - heled by celexa  10 mg 20 caused apathys ometimes winter flare then resolves disc lsi light therapy.  Macrocytosis without anemia - take vits check b12 and folate  - Plan: Vitamin B12, Folate  Tobacco use disorder - had se or oether tech didnt work still advice attempt to quit  Colon cancer screening - Plan: Fecal occult blood, imunochemical Counseled regarding healthy nutrition, exercise, sleep, injury prevention, calcium vit d and healthy weight .Counseled. About colon cancer screening  At least do stool cards until does oscopy Patient Care Team: Burnis Medin, MD as PCP - General Despina Hick, MD (Dermatology) Judie Bonus, MD (Obstetrics and Gynecology) Jeannette How. Arredondo (Breast Surgery) Patient Instructions  Will notify you  of labs when available.  Do stool cards yearly for colon cancer screening  Until you get colonoscopy.  Healthy lifestyle includes : At least 150 minutes of exercise weeks  , weight at healthy levels, which is usually   BMI 19-25. Avoid trans fats and processed foods;  Increase fresh fruits and veges to 5 servings per day. And avoid sweet beverages including tea and juice. Mediterranean diet with olive oil and nuts have been noted to be heart and brain healthy . Avoid tobacco products . Limit  alcohol to  7 per week for women and 14 servings for men.  Get adequate sleep . Wear seat belts . Don't text and drive .  Let us know if we can help stopping tobacco .         Standley Brooking. Zebulun Deman M.D.    Medication List         This list is accurate as of: 12/13/14  5:19 PM.  Always use your most recent med list.               ALIGN 4 MG Caps  Take by mouth.     aspirin 81 MG tablet  Take 81 mg by mouth daily.     Biotin 10 MG Tabs  Take by mouth.     cholecalciferol 1000 UNITS tablet  Commonly known as:  VITAMIN D  Take 4,000 Units by mouth daily.     citalopram 10 MG tablet  Commonly known as:  CELEXA  TAKE 1 TABLET EVERY DAY     COD LIVER OIL PO  Take by mouth.     glucosamine-chondroitin 500-400 MG tablet  Take 1 tablet by mouth 3 (three) times daily.     MOVIPREP 100 G Solr  Generic drug:  peg 3350 powder  Take 1 kit (200 g total) by mouth once.     MULTIPLE VITAMIN PO  Take by mouth.

## 2014-12-13 NOTE — Patient Instructions (Signed)
Will notify you  of labs when available.  Do stool cards yearly for colon cancer screening  Until you get colonoscopy.  Healthy lifestyle includes : At least 150 minutes of exercise weeks  , weight at healthy levels, which is usually   BMI 19-25. Avoid trans fats and processed foods;  Increase fresh fruits and veges to 5 servings per day. And avoid sweet beverages including tea and juice. Mediterranean diet with olive oil and nuts have been noted to be heart and brain healthy . Avoid tobacco products . Limit  alcohol to  7 per week for women and 14 servings for men.  Get adequate sleep . Wear seat belts . Don't text and drive .  Let us know if we can help stopping tobacco .

## 2014-12-14 LAB — FOLATE: Folate: 21.1 ng/mL (ref >5.9)

## 2014-12-14 LAB — VITAMIN B12: Vitamin B-12: 454 pg/mL (ref 211–911)

## 2015-01-22 ENCOUNTER — Encounter: Payer: Self-pay | Admitting: Cardiology

## 2015-02-19 ENCOUNTER — Other Ambulatory Visit: Payer: Self-pay | Admitting: Internal Medicine

## 2015-02-21 NOTE — Telephone Encounter (Signed)
Sent to the pharmacy by e-scribe. 

## 2015-05-24 ENCOUNTER — Telehealth: Payer: Self-pay | Admitting: Internal Medicine

## 2015-05-24 MED ORDER — CITALOPRAM HYDROBROMIDE 10 MG PO TABS: 10.0000 mg | ORAL_TABLET | Freq: Every day | ORAL | Status: DC

## 2015-05-24 NOTE — Telephone Encounter (Signed)
Sent to the pharmacy by e-scribe. 

## 2015-05-24 NOTE — Telephone Encounter (Signed)
Pt needs new rx citalopram 10 mg #90 w/refills [facility]

## 2015-08-28 ENCOUNTER — Encounter: Payer: Self-pay | Admitting: Internal Medicine

## 2015-09-07 ENCOUNTER — Telehealth: Payer: Self-pay | Admitting: Internal Medicine

## 2015-09-07 NOTE — Telephone Encounter (Signed)
Pt would like a trial rx for ambien cr sent to [facility] in [place] . Pt is aware md out of office until monday

## 2015-09-11 NOTE — Telephone Encounter (Signed)
Pt states they had discuss in past

## 2015-09-11 NOTE — Telephone Encounter (Signed)
Dr. Regis Bill has never wrote a prescription for Ambien for this pt.  She will need a 30 minute appt to come in and discuss.  Please help her to make that appt.  Thanks!

## 2015-09-13 NOTE — Telephone Encounter (Signed)
Noted   Still need  Ov to begin new med  .

## 2015-09-14 NOTE — Telephone Encounter (Signed)
Pt has been sch

## 2015-09-20 ENCOUNTER — Encounter: Payer: Self-pay | Admitting: Internal Medicine

## 2015-09-20 ENCOUNTER — Ambulatory Visit (INDEPENDENT_AMBULATORY_CARE_PROVIDER_SITE_OTHER): Payer: 59 | Admitting: Internal Medicine

## 2015-09-20 VITALS — BP 138/80 | Temp 99.1°F | Ht 62.5 in | Wt 138.2 lb

## 2015-09-20 DIAGNOSIS — G4700 Insomnia, unspecified: Secondary | ICD-10-CM | POA: Diagnosis not present

## 2015-09-20 DIAGNOSIS — J029 Acute pharyngitis, unspecified: Secondary | ICD-10-CM | POA: Diagnosis not present

## 2015-09-20 DIAGNOSIS — F4322 Adjustment disorder with anxiety: Secondary | ICD-10-CM | POA: Diagnosis not present

## 2015-09-20 DIAGNOSIS — Z79899 Other long term (current) drug therapy: Secondary | ICD-10-CM | POA: Diagnosis not present

## 2015-09-20 MED ORDER — MIRTAZAPINE 15 MG PO TABS: 15.0000 mg | ORAL_TABLET | Freq: Every day | ORAL | Status: DC

## 2015-09-20 NOTE — Progress Notes (Signed)
Chief Complaint  Patient presents with  . Follow-up    Discuss sleep medication  . Sore Throat    x1 day    HPI: Jessica Cooper 55 y.o.  To discuss problem with sleep Also has a scratchy throat on one side today  No fever  Sleep medication issues : In last 3-4 months  Sleep a lot worse   Effecting  Thinking  And such  Tired   Did some research.   On web MD.   Stress inc celexa caused her flatness .  She is aware risk of meds and prefers not to use ambien . ? remeron or trazadone . Falls asleep and then awakens suddenly?   23  Min  Awakens .   driffts off and on all night and doesn't think sleeps well.  etoh once a night  No other  Thinks anxiety part of it but doing interventions  Still tobacco ROS: See pertinent positives and negatives per HPI.  Past Medical History  Diagnosis Date  . Depression   . Anxiety   . Hyperlipidemia     runs around 245  . 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 Heart failure Father     heart valve problem   . Hypertension Father   . Breast cancer Sister   . Crohn's disease Sister   . Heart failure Maternal Grandmother   . Colon cancer Neg Hx     Social History   Social History  . Marital Status: Married    Spouse Name: N/A  . Number of Children: N/A  . Years of Education: N/A   Social History Main Topics  . Smoking status: Current Every Day Smoker  . Smokeless tobacco: Never Used  . Alcohol Use: 4.2 oz/week    7 Glasses of wine per week  . Drug Use: No  . Sexual Activity: Not Asked   Other Topics Concern  . None   Social History Narrative   Occupation: Research scientist (medical)   Married   Regular exercise- no   Smoker    HH of 2   5 pets  2 dogs and cats.  One outside cat.    Moved from [place] from job   G0P0    Outpatient Prescriptions Prior to Visit  Medication Sig Dispense  Refill  . cholecalciferol (VITAMIN D) 1000 UNITS tablet Take 4,000 Units by mouth daily.     . citalopram (CELEXA) 10 MG tablet Take 1 tablet (10 mg total) by mouth daily. 90 tablet 1  . COD LIVER OIL PO Take by mouth.      Marland Kitchen glucosamine-chondroitin 500-400 MG tablet Take 1 tablet by mouth 3 (three) times daily.      Marland Kitchen MOVIPREP 100 G SOLR Take 1 kit (200 g total) by mouth once. 1 kit 0  . Probiotic Product (ALIGN) 4 MG CAPS Take by mouth.      Marland Kitchen aspirin 81 MG tablet Take 81 mg by mouth daily. Reported on 09/20/2015    . Biotin 10 MG TABS Take by mouth. Reported on 09/20/2015    . MULTIPLE VITAMIN PO Take by mouth. Reported on 09/20/2015     No facility-administered medications prior to visit.     EXAM:  BP 138/80 mmHg  Temp(Src) 99.1 F (37.3 C) (Oral)  Ht 5'  2.5" (1.588 m)  Wt 138 lb 3.2 oz (62.687 kg)  BMI 24.86 kg/m2  Body mass index is 24.86 kg/(m^2).  GENERAL: vitals reviewed and listed above, alert, oriented, appears well hydrated and in no acute distress HEENT: atraumatic, conjunctiva  clear, no obvious abnormalities on inspection of external nose and ears OP : no lesion edema or exudate   NECK: no obvious masses on inspection palpation  PSYCH: pleasant and cooperative, no obvious depression or anxiety  ASSESSMENT AND PLAN:  Discussed the following assessment and plan:  No diagnosis found. reviewed strategies and     Risk benefit of medication discussed.  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  Patient Instructions  Consider cognitive training    For sleep  In addition to meds  Anxiety  A common cauise of sleep issues  Stop the alcohol .     For now until sleep better    Counseling about the anxiety.   May help also as discussed .      Insomnia Insomnia is a sleep disorder that makes it difficult to fall asleep or to stay asleep. Insomnia can cause tiredness (fatigue), low energy, difficulty concentrating, mood swings, and poor performance at work or school.  There are three different ways to classify insomnia:  Difficulty falling asleep.  Difficulty staying asleep.  Waking up too early in the morning. Any type of insomnia can be long-term (chronic) or short-term (acute). Both are common. Short-term insomnia usually lasts for three months or less. Chronic insomnia occurs at least three times a week for longer than three months. CAUSES  Insomnia may be caused by another condition, situation, or substance, such as:  Anxiety.  Certain medicines.  Gastroesophageal reflux disease (GERD) or other gastrointestinal conditions.  Asthma or other breathing conditions.  Restless legs syndrome, sleep apnea, or other sleep disorders.  Chronic pain.  Menopause. This may include hot flashes.  Stroke.  Abuse of alcohol, tobacco, or illegal drugs.  Depression.  Caffeine.   Neurological disorders, such as Alzheimer disease.  An overactive thyroid (hyperthyroidism). The cause of insomnia may not be known. RISK FACTORS Risk factors for insomnia include:  Gender. Women are more commonly affected than men.  Age. Insomnia is more common as you get older.  Stress. This may involve your professional or personal life.  Income. Insomnia is more common in people with lower income.  Lack of exercise.   Irregular work schedule or night shifts.  Traveling between different time zones. SIGNS AND SYMPTOMS If you have insomnia, trouble falling asleep or trouble staying asleep is the main symptom. This may lead to other symptoms, such as:  Feeling fatigued.  Feeling nervous about going to sleep.  Not feeling rested in the morning.  Having  trouble concentrating.  Feeling irritable, anxious, or depressed. TREATMENT  Treatment for insomnia depends on the cause. If your insomnia is caused by an underlying condition, treatment will focus on addressing the condition. Treatment may also include:   Medicines to help you sleep.  Counseling or therapy.  Lifestyle adjustments. HOME CARE INSTRUCTIONS   Take medicines only as directed by your health care provider.  Keep regular  sleeping and waking hours. Avoid naps.  Keep a sleep diary to help you and your health care provider figure out what could be causing your insomnia. Include:   When you sleep.  When you wake up during the night.  How well you sleep.   How rested you feel the next day.  Any side effects of medicines you are taking.  What you eat and drink.   Make your bedroom a comfortable place where it is easy to fall asleep:  Put up shades or special blackout curtains to block light from outside.  Use a white noise machine to block noise.  Keep the temperature cool.   Exercise regularly as directed by your health care provider. Avoid exercising right before bedtime.  Use relaxation techniques to manage stress. Ask your health care provider to suggest some techniques that may work well for you. These may include:  Breathing exercises.  Routines to release muscle tension.  Visualizing peaceful scenes.  Cut back on alcohol, caffeinated beverages, and cigarettes, especially close to bedtime. These can disrupt your sleep.  Do not overeat or eat spicy foods right before bedtime. This can lead to digestive discomfort that can make it hard for you to sleep.  Limit screen use before bedtime. This includes:  Watching TV.  Using your smartphone, tablet, and computer.  Stick to a routine. This can help you fall asleep faster. Try to do a quiet activity, brush your teeth, and go to bed at the same time each night.  Get out of bed if you are still awake  after 15 minutes of trying to sleep. Keep the lights down, but try reading or doing a quiet activity. When you feel sleepy, go back to bed.  Make sure that you drive carefully. Avoid driving if you feel very sleepy.  Keep all follow-up appointments as directed by your health care provider. This is important. SEEK MEDICAL CARE IF:   You are tired throughout the day or have trouble in your daily routine due to sleepiness.  You continue to have sleep problems or your sleep problems get worse. SEEK IMMEDIATE MEDICAL CARE IF:   You have serious thoughts about hurting yourself or someone else.   This information is not intended to replace advice given to you by your health care provider. Make sure you discuss any questions you have with your health care provider.   Document Released: 06/07/2000 Document Revised: 03/01/2015 Document Reviewed: 03/11/2014 Elsevier Interactive Patient Education 2016 [place]. Jendayi Berling M.D.

## 2015-09-20 NOTE — Patient Instructions (Addendum)
Consider cognitive training    For sleep  In addition to meds  Anxiety  A common cauise of sleep issues  Stop the alcohol .     For now until sleep better  Counseling about the anxiety.   May help also as discussed .      Insomnia Insomnia is a sleep disorder that makes it difficult to fall asleep or to stay asleep. Insomnia can cause tiredness (fatigue), low energy, difficulty concentrating, mood swings, and poor performance at work or school.  There are three different ways to classify insomnia:  Difficulty falling asleep.  Difficulty staying asleep.  Waking up too early in the morning. Any type of insomnia can be long-term (chronic) or short-term (acute). Both are common. Short-term insomnia usually lasts for three months or less. Chronic insomnia occurs at least three times a week for longer than three months. CAUSES  Insomnia may be caused by another condition, situation, or substance, such as:  Anxiety.  Certain medicines.  Gastroesophageal reflux disease (GERD) or other gastrointestinal conditions.  Asthma or other breathing conditions.  Restless legs syndrome, sleep apnea, or other sleep disorders.  Chronic pain.  Menopause. This may include hot flashes.  Stroke.  Abuse of alcohol, tobacco, or illegal drugs.  Depression.  Caffeine.   Neurological disorders, such as Alzheimer disease.  An overactive thyroid (hyperthyroidism). The cause of insomnia may not be known. RISK FACTORS Risk factors for insomnia include:  Gender. Women are more commonly affected than men.  Age. Insomnia is more common as you get older.  Stress. This may involve your professional or personal life.  Income. Insomnia is more common in people with lower income.  Lack of exercise.   Irregular work schedule or night shifts.  Traveling between different time zones. SIGNS AND SYMPTOMS If you have insomnia, trouble falling asleep or trouble staying asleep is the main symptom.  This may lead to other symptoms, such as:  Feeling fatigued.  Feeling nervous about going to sleep.  Not feeling rested in the morning.  Having trouble concentrating.  Feeling irritable, anxious, or depressed. TREATMENT  Treatment for insomnia depends on the cause. If your insomnia is caused by an underlying condition, treatment will focus on addressing the condition. Treatment may also include:   Medicines to help you sleep.  Counseling or therapy.  Lifestyle adjustments. HOME CARE INSTRUCTIONS   Take medicines only as directed by your health care provider.  Keep regular sleeping and waking hours. Avoid naps.  Keep a sleep diary to help you and your health care provider figure out what could be causing your insomnia. Include:   When you sleep.  When you wake up during the night.  How well you sleep.   How rested you feel the next day.  Any side effects of medicines you are taking.  What you eat and drink.   Make your bedroom a comfortable place where it is easy to fall asleep:  Put up shades or special blackout curtains to block light from outside.  Use a white noise machine to block noise.  Keep the temperature cool.   Exercise regularly as directed by your health care provider. Avoid exercising right before bedtime.  Use relaxation techniques to manage stress. Ask your health care provider to suggest some techniques that may work well for you. These may include:  Breathing exercises.  Routines to release muscle tension.  Visualizing peaceful scenes.  Cut back on alcohol, caffeinated beverages, and cigarettes, especially close to bedtime. These  can disrupt your sleep.  Do not overeat or eat spicy foods right before bedtime. This can lead to digestive discomfort that can make it hard for you to sleep.  Limit screen use before bedtime. This includes:  Watching TV.  Using your smartphone, tablet, and computer.  Stick to a routine. This can help  you fall asleep faster. Try to do a quiet activity, brush your teeth, and go to bed at the same time each night.  Get out of bed if you are still awake after 15 minutes of trying to sleep. Keep the lights down, but try reading or doing a quiet activity. When you feel sleepy, go back to bed.  Make sure that you drive carefully. Avoid driving if you feel very sleepy.  Keep all follow-up appointments as directed by your health care provider. This is important. SEEK MEDICAL CARE IF:   You are tired throughout the day or have trouble in your daily routine due to sleepiness.  You continue to have sleep problems or your sleep problems get worse. SEEK IMMEDIATE MEDICAL CARE IF:   You have serious thoughts about hurting yourself or someone else.   This information is not intended to replace advice given to you by your health care provider. Make sure you discuss any questions you have with your health care provider.   Document Released: 06/07/2000 Document Revised: 03/01/2015 Document Reviewed: 03/11/2014 Elsevier Interactive Patient Education [employer].

## 2015-10-06 ENCOUNTER — Other Ambulatory Visit: Payer: Self-pay | Admitting: Internal Medicine

## 2015-10-09 NOTE — Telephone Encounter (Signed)
Sent to the pharmacy by e-scribe. 

## 2015-10-30 ENCOUNTER — Ambulatory Visit (AMBULATORY_SURGERY_CENTER): Payer: Self-pay | Admitting: *Deleted

## 2015-10-30 VITALS — Ht 65.0 in | Wt 140.4 lb

## 2015-10-30 DIAGNOSIS — Z1211 Encounter for screening for malignant neoplasm of colon: Secondary | ICD-10-CM

## 2015-10-30 MED ORDER — SUPREP BOWEL PREP KIT 17.5-3.13-1.6 GM/177ML PO SOLN: 1.0000 | Freq: Once | ORAL | Status: DC

## 2015-10-30 NOTE — Progress Notes (Signed)
Patient denies any allergies to egg or soy products. Patient denies complications with anesthesia/sedation.  Patient denies oxygen use at home and denies diet medications. Emmi instructions for colonoscopy explained but patient denied.     

## 2015-10-31 ENCOUNTER — Encounter: Payer: Self-pay | Admitting: Internal Medicine

## 2015-11-06 ENCOUNTER — Encounter: Payer: Self-pay | Admitting: Internal Medicine

## 2015-11-06 ENCOUNTER — Ambulatory Visit (INDEPENDENT_AMBULATORY_CARE_PROVIDER_SITE_OTHER): Payer: 59 | Admitting: Internal Medicine

## 2015-11-06 VITALS — BP 120/80 | Temp 98.5°F | Ht 65.0 in | Wt 142.0 lb

## 2015-11-06 DIAGNOSIS — Z79899 Other long term (current) drug therapy: Secondary | ICD-10-CM

## 2015-11-06 DIAGNOSIS — G4700 Insomnia, unspecified: Secondary | ICD-10-CM | POA: Diagnosis not present

## 2015-11-06 DIAGNOSIS — F4322 Adjustment disorder with anxiety: Secondary | ICD-10-CM

## 2015-11-06 MED ORDER — MIRTAZAPINE 15 MG PO TABS: 7.5000 mg | ORAL_TABLET | Freq: Every day | ORAL | Status: DC

## 2015-11-06 NOTE — Progress Notes (Signed)
Chief Complaint  Patient presents with  . Follow-up    HPI: Jessica Cooper 55 y.o.  Fu   From med change for  Sleep .  Falling asleep  Less wakenings   . Med seems to help  bt only taking 7.5 mg at this time some weight gain but poss from travel and eating change  Thinks this will help a good bit   For the anxiety   ROS: See pertinent positives and negatives per HPI.  Past Medical History  Diagnosis Date  . Depression   . Anxiety   . 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 Hyperlipidemia     runs around 245 - diet controlled, no meds    Family History  Problem Relation Age of Onset  . Heart failure Father     heart valve problem   . Hypertension Father   . Breast cancer Sister   . Crohn's disease Sister   . Heart failure Maternal Grandmother   . Colon cancer Neg Hx   . Esophageal cancer Neg Hx   . Rectal cancer Neg Hx   . Stomach cancer Neg Hx     Social History   Social History  . Marital Status: Married    Spouse Name: N/A  . Number of Children: N/A  . Years of Education: N/A   Social History Main Topics  . Smoking status: Current Every Day Smoker -- 1.00 packs/day for 30 years    Types: Cigarettes  . Smokeless tobacco: Never Used  . Alcohol Use: 4.2 oz/week    7 Glasses of wine per week  . Drug Use: No  . Sexual Activity: Yes    Birth Control/ Protection: Surgical     Comment: Abd Hysterectomy   Other Topics Concern  . None   Social History Narrative   Occupation: Research scientist (medical)   Married   Regular exercise- no   Smoker    HH of 2   5 pets  2 dogs and cats.  One outside cat.    Moved from [place] from job   G0P0    Outpatient Prescriptions Prior to Visit  Medication Sig Dispense Refill  . Biotin 10 MG TABS Take by mouth. Reported on 09/20/2015    . cholecalciferol (VITAMIN D) 1000 UNITS tablet Take 4,000 Units by mouth daily.     . citalopram  (CELEXA) 10 MG tablet Take 1 tablet by mouth  daily 90 tablet 0  . COD LIVER OIL PO Take by mouth.      Marland Kitchen glucosamine-chondroitin 500-400 MG tablet Take 1 tablet by mouth 3 (three) times daily.      . MULTIPLE VITAMIN PO Take by mouth. Reported on 09/20/2015    . Probiotic Product (ALIGN) 4 MG CAPS Take by mouth.      [place] Gunning BOWEL PREP SOLN Take 1 kit by mouth once. Brand Name Only.  No Substitutions.  Suprep Bowel Kit. 177 mL 0  . mirtazapine (REMERON) 15 MG tablet Take 1 tablet (15 mg total) by mouth at bedtime. (Patient taking differently: Take 15 mg by mouth at bedtime. Takes 1/2 - 7.16m nightly) 30 tablet 3   No facility-administered medications prior to visit.     EXAM:  BP 120/80 mmHg  Temp(Src) 98.5 F (36.9 C) (Oral)  Ht '5\' 5"'  (1.651 m)  Wt 142 lb (64.411 kg)  BMI  23.63 kg/m2  Body mass index is 23.63 kg/(m^2).  GENERAL: vitals reviewed and listed above, alert, oriented, appears well hydrated and in no acute distress HEENT: atraumatic, conjunctiva  clear, no obvious abnormalities on inspection of external nose and ears PSYCH: pleasant and cooperative, no obvious depression or anxiety  ASSESSMENT AND PLAN:  Discussed the following assessment and plan:  Insomnia  Medication management  Adjustment disorder with anxious mood Still 1.5 wine per night .  Ok  Doing better   Risk benefit of medication discussed. Etc  continiue    ROV in 6 months OR can call or email if doing well and then yearly -Patient advised to return or notify health care team  if symptoms worsen ,persist or new concerns arise.  Patient Instructions  Continue med   For a whoile  7.5 is an ok dose .  Stay on this  For another month and then as needed.    Pay attention to sleep hygiene.  Get your colonoscopy.   s ROV in 6 months OR can call or email if doing well instead of ov and thenat her  Yearly OV>      Standley Brooking. [name] M.D.

## 2015-11-06 NOTE — Patient Instructions (Addendum)
Continue med   For a whoile  7.5 is an ok dose .  Stay on this  For another month and then as needed.    Pay attention to sleep hygiene.  Get your colonoscopy.   s ROV in 6 months OR can call or email if doing well instead of ov and thenat her  Yearly OV>

## 2015-11-13 ENCOUNTER — Ambulatory Visit (AMBULATORY_SURGERY_CENTER): Payer: 59 | Admitting: Internal Medicine

## 2015-11-13 ENCOUNTER — Encounter: Payer: Self-pay | Admitting: Internal Medicine

## 2015-11-13 VITALS — BP 109/64 | HR 50 | Temp 98.2°F | Resp 19 | Ht 65.0 in | Wt 140.0 lb

## 2015-11-13 DIAGNOSIS — D124 Benign neoplasm of descending colon: Secondary | ICD-10-CM

## 2015-11-13 DIAGNOSIS — D125 Benign neoplasm of sigmoid colon: Secondary | ICD-10-CM | POA: Diagnosis not present

## 2015-11-13 DIAGNOSIS — Z1211 Encounter for screening for malignant neoplasm of colon: Secondary | ICD-10-CM

## 2015-11-13 DIAGNOSIS — D123 Benign neoplasm of transverse colon: Secondary | ICD-10-CM

## 2015-11-13 DIAGNOSIS — D122 Benign neoplasm of ascending colon: Secondary | ICD-10-CM | POA: Diagnosis not present

## 2015-11-13 MED ORDER — SODIUM CHLORIDE 0.9 % IV SOLN: 500.0000 mL | INTRAVENOUS | Status: DC

## 2015-11-13 NOTE — Progress Notes (Signed)
Report given to PACU RN, vss 

## 2015-11-13 NOTE — Op Note (Signed)
[facility] Patient Name: Jessica Cooper Procedure Date: 11/13/2015 9:43 AM MRN: [id number] Endoscopist: Jerene Bears , MD Age: 55 Referring MD:  Date of Birth: 1960-11-17 Gender: Female Procedure:                Colonoscopy Indications:              Screening for colorectal malignant neoplasm, This                            is the patient's first colonoscopy Medicines:                Monitored Anesthesia Care Procedure:                Pre-Anesthesia Assessment:                           - Prior to the procedure, a History and Physical                            was performed, and patient medications and                            allergies were reviewed. The patient's tolerance of                            previous anesthesia was also reviewed. The risks                            and benefits of the procedure and the sedation                            options and risks were discussed with the patient.                            All questions were answered, and informed consent                            was obtained. Prior Anticoagulants: The patient has                            taken no previous anticoagulant or antiplatelet                            agents. ASA Grade Assessment: II - A patient with                            mild systemic disease. After reviewing the risks                            and benefits, the patient was deemed in                            satisfactory condition to undergo the procedure.  After obtaining informed consent, the colonoscope                            was passed under direct vision. Throughout the                            procedure, the patient's blood pressure, pulse, and                            oxygen saturations were monitored continuously. The                            Model PCF-H190L [id number]) scope was introduced                            through the anus and advanced to the the cecum,                            identified by appendiceal orifice and ileocecal                            valve. The colonoscopy was performed without                            difficulty. The patient tolerated the procedure                            well. The quality of the bowel preparation was                            good. The ileocecal valve, appendiceal orifice, and                            rectum were photographed. Scope In: 9:49:06 AM Scope Out: 10:08:33 AM Scope Withdrawal Time: 0 hours 14 minutes 24 seconds  Total Procedure Duration: 0 hours 19 minutes 27 seconds  Findings:                 Two sessile polyps were found in the ascending                            colon. The polyps were 2 to 5 mm in size. These                            polyps were removed with a cold snare. Resection                            and retrieval were complete.                           Two sessile polyps were found in the transverse                            colon. The polyps were 4 to 5 mm  in size. These                            polyps were removed with a cold snare. Resection                            and retrieval were complete.                           Two sessile polyps were found in the sigmoid colon                            and descending colon. The polyps were 4 to 5 mm in                            size. These polyps were removed with a cold snare.                            Resection and retrieval were complete.                           A few diverticula were found in the sigmoid colon.                           Internal hemorrhoids were found during                            retroflexion. The hemorrhoids were small. Complications:            No immediate complications. Estimated Blood Loss:     Estimated blood loss: none. Impression:               - Two 2 to 5 mm polyps in the ascending colon,                            removed with a cold snare. Resected and retrieved.                            - Two 4 to 5 mm polyps in the transverse colon,                            removed with a cold snare. Resected and retrieved.                           - Two 4 to 5 mm polyps in the sigmoid colon and in                            the descending colon, removed with a cold snare.                            Resected and retrieved.                           - Diverticulosis  in the sigmoid colon.                           - Internal hemorrhoids. Recommendation:           - Patient has a contact number available for                            emergencies. The signs and symptoms of potential                            delayed complications were discussed with the                            patient. Return to normal activities tomorrow.                            Written discharge instructions were provided to the                            patient.                           - Resume previous diet.                           - Continue present medications.                           - Await pathology results.                           - Repeat colonoscopy is recommended for                            surveillance. The colonoscopy date will be                            determined after pathology results from today's                            exam become available for review. Jerene Bears, MD 11/13/2015 10:13:32 AM This report has been signed electronically.

## 2015-11-13 NOTE — Progress Notes (Signed)
Called to room to assist during endoscopic procedure.  Patient ID and intended procedure confirmed with present staff. Received instructions for my participation in the procedure from the performing physician.  

## 2015-11-13 NOTE — Patient Instructions (Addendum)
YOU HAD AN ENDOSCOPIC PROCEDURE TODAY AT THE  ENDOSCOPY CENTER:   Refer to the procedure report that was given to you for any specific questions about what was found during the examination.  If the procedure report does not answer your questions, please call your gastroenterologist to clarify.  If you requested that your care partner not be given the details of your procedure findings, then the procedure report has been included in a sealed envelope for you to review at your convenience later.  YOU SHOULD EXPECT: Some feelings of bloating in the abdomen. Passage of more gas than usual.  Walking can help get rid of the air that was put into your GI tract during the procedure and reduce the bloating. If you had a lower endoscopy (such as a colonoscopy or flexible sigmoidoscopy) you may notice spotting of blood in your stool or on the toilet paper. If you underwent a bowel prep for your procedure, you may not have a normal bowel movement for a few days.  Please Note:  You might notice some irritation and congestion in your nose or some drainage.  This is from the oxygen used during your procedure.  There is no need for concern and it should clear up in a day or so.  SYMPTOMS TO REPORT IMMEDIATELY:   Following lower endoscopy (colonoscopy or flexible sigmoidoscopy):  Excessive amounts of blood in the stool  Significant tenderness or worsening of abdominal pains  Swelling of the abdomen that is new, acute  Fever of 100F or higher   For urgent or emergent issues, a gastroenterologist can be reached at any hour by calling [phone].   DIET: Your first meal following the procedure should be a small meal and then it is ok to progress to your normal diet. Heavy or fried foods are harder to digest and may make you feel nauseous or bloated.  Likewise, meals heavy in dairy and vegetables can increase bloating.  Drink plenty of fluids but you should avoid alcoholic beverages for 24 hours. Try to  increase the fiber in your diet, and drink plenty of water.  ACTIVITY:  You should plan to take it easy for the rest of today and you should NOT DRIVE or use heavy machinery until tomorrow (because of the sedation medicines used during the test).    FOLLOW UP: Our staff will call the number listed on your records the next business day following your procedure to check on you and address any questions or concerns that you may have regarding the information given to you following your procedure. If we do not reach you, we will leave a message.  However, if you are feeling well and you are not experiencing any problems, there is no need to return our call.  We will assume that you have returned to your regular daily activities without incident.  If any biopsies were taken you will be contacted by phone or by letter within the next 1-3 weeks.  Please call us at [phone] if you have not heard about the biopsies in 3 weeks.    SIGNATURES/CONFIDENTIALITY: You and/or your care partner have signed paperwork which will be entered into your electronic medical record.  These signatures attest to the fact that that the information above on your After Visit Summary has been reviewed and is understood.  Full responsibility of the confidentiality of this discharge information lies with you and/or your care-partner.  Read all of the handouts given to you by your recovery   recovery room nurse.  Thank-you for choosing [facility] for your healthcare needs today.  Stop Smoking!!!

## 2015-11-14 ENCOUNTER — Telehealth: Payer: Self-pay | Admitting: *Deleted

## 2015-11-14 NOTE — Telephone Encounter (Signed)
  Follow up Call-  Call back number 11/13/2015  Post procedure Call Back phone  # [phone] cell  Permission to leave phone message Yes     Patient questions:  Do you have a fever, pain , or abdominal swelling? No. Pain Score  0 *  Have you tolerated food without any problems? Yes.    Have you been able to return to your normal activities? Yes.    Do you have any questions about your discharge instructions: Diet   No. Medications  No. Follow up visit  No.  Do you have questions or concerns about your Care? No.  Actions: * If pain score is 4 or above: No action needed, pain <4.

## 2015-11-17 ENCOUNTER — Encounter: Payer: Self-pay | Admitting: Internal Medicine

## 2015-11-22 ENCOUNTER — Encounter: Payer: Self-pay | Admitting: Internal Medicine

## 2015-12-06 NOTE — Telephone Encounter (Signed)
Misty please  See if this is corrected and have dr Garth Schlatter office contact her if not done about results ( apologies for late call  Out of town last week) Earleen Newport

## 2015-12-13 ENCOUNTER — Telehealth: Payer: Self-pay | Admitting: Internal Medicine

## 2015-12-13 NOTE — Progress Notes (Signed)
Pre visit review using our clinic review tool, if applicable. No additional management support is needed unless otherwise documented below in the visit note.  Chief Complaint  Patient presents with  . Eye Problem    Blood vessel popped yesterday.  Felt a sharp pain when it happened.  No pain at the eye today.  Not affecting vision.  Does have a mild headache.    HPI: Jessica Cooper 55 y.o.  Comes in for new problem   Yesterday was working at the computer at work and had sudden onset of bleeding in the right sub-conjunctival area she felt it with when intermediate may be a mild headache and no vision change or other symptom. She went to the nurse and her blood pressure was 133 and 138/70 range. She called the on-call nurse triage an appointment was made for today.  No new symptoms no vision change she does have a small growths on the medial area of her right eye. Remote history 7 years ago for similar subsequent conjunctival hemorrhage that resolved on its.  No neurologic symptoms no bleeding elsewhere no aspirin.  ROS: See pertinent positives and negatives per HPI.Generally negative but feels sometimes like she is radiating heat.  Past Medical History  Diagnosis Date  . Depression   . Anxiety   . 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 Hyperlipidemia     runs around 245 - diet controlled, no meds    Family History  Problem Relation Age of Onset  . Heart failure Father     heart valve problem   . Hypertension Father   . Breast cancer Sister   . Crohn's disease Sister   . Heart failure Maternal Grandmother   . Colon cancer Neg Hx   . Esophageal cancer Neg Hx   . Rectal cancer Neg Hx   . Stomach cancer Neg Hx     Social History   Social History  . Marital Status: Married    Spouse Name: N/A  . Number of Children: N/A  . Years of Education: N/A   Social History Main Topics  . Smoking  status: Current Every Day Smoker -- 1.00 packs/day for 30 years    Types: Cigarettes  . Smokeless tobacco: Never Used  . Alcohol Use: 4.2 oz/week    7 Glasses of wine per week  . Drug Use: No  . Sexual Activity: Yes    Birth Control/ Protection: Surgical     Comment: Abd Hysterectomy   Other Topics Concern  . None   Social History Narrative   Occupation: Research scientist (medical)   Married   Regular exercise- no   Smoker    HH of 2   5 pets  2 dogs and cats.  One outside cat.    Moved from [place] from job   G0P0    Outpatient Prescriptions Prior to Visit  Medication Sig Dispense Refill  . Biotin 10 MG TABS Take by mouth. Reported on 09/20/2015    . cholecalciferol (VITAMIN D) 1000 UNITS tablet Take 4,000 Units by mouth daily.     . citalopram (CELEXA) 10 MG tablet Take 1 tablet by mouth  daily 90 tablet 0  . COD LIVER OIL PO Take by mouth.      Marland Kitchen glucosamine-chondroitin 500-400 MG tablet Take 1 tablet by mouth 3 (three) times daily.      Marland Kitchen  mirtazapine (REMERON) 15 MG tablet Take 0.5-1 tablets (7.5-15 mg total) by mouth at bedtime. 30 tablet 5  . MULTIPLE VITAMIN PO Take by mouth. Reported on 09/20/2015    . Probiotic Product (ALIGN) 4 MG CAPS Take by mouth.       No facility-administered medications prior to visit.     EXAM:  BP 118/72 mmHg  Temp(Src) 98.3 F (36.8 C) (Oral)  Wt 142 lb 12.8 oz (64.774 kg)  Body mass index is 23.76 kg/(m^2).  GENERAL: vitals reviewed and listed above, alert, oriented, appears well hydrated and in no acute distress With obvious right medial subconjunctival hemorrhage. HEENT: atraumatic, conjunctiva  subconjunctival hemorrhage as above slight prominence question pterygium versus others medially EOMs are full PERRLA no photophobia., no obvious abnormalities on inspection of external nose and ears tms clear OP : no lesion edema or exudate  NECK: no obvious masses on inspection palpation  CV: HRRR, no clubbing cyanosis or  peripheral  edema nl cap refill  MS: moves all extremities without noticeable focal  Abnormality No obvious bruising bleeding or petechiae. Neurologic grossly intact. PSYCH: pleasant and cooperative, no obvious depression or anxiety  ASSESSMENT AND PLAN:  Discussed the following assessment and plan:  Subconjunctival bleed, left - Plan: CBC with Differential/Platelet, Basic metabolic panel, TSH, Hepatitis C antibody  Need for hepatitis C screening test - Plan: Hepatitis C antibody Med review  -Patient advised to return or notify health care team  if symptoms worsen ,persist or new concerns arise.  Patient Instructions  Will notify you  of labs when available. You blood pressure is fine . May want to fu with eye doc as directed but dont think you have HT or serious eye disease causing this.   Let us know if new sx happening .      Standley Brooking. Panosh M.D.

## 2015-12-13 NOTE — Telephone Encounter (Signed)
FYI pt scheduled to see Dr Regis Bill tomorrow

## 2015-12-13 NOTE — Telephone Encounter (Signed)
Patient Name: Jessica Cooper  DOB: 09-06-1960    Initial Comment Caller states she had a blood vessel in her eye pop a few minutes ago. BP is 133/73, 138,175. She can feel it in her eye. like a dry sensation.   Nurse Assessment  Nurse: Raphael Gibney, RN, Vanita Ingles Date/Time (Eastern Time): 12/13/2015 11:38:10 AM  Confirm and document reason for call. If symptomatic, describe symptoms. You must click the next button to save text entered. ---Caller states she popped a blood vessel in her right eye a few min. ago. No pain. BP 133/73 and 138/75. Has slight headache.  Has the patient traveled out of the country within the last 30 days? ---Not Applicable  Does the patient have any new or worsening symptoms? ---Yes  Will a triage be completed? ---Yes  Related visit to physician within the last 2 weeks? ---No  Does the PT have any chronic conditions? (i.e. diabetes, asthma, etc.) ---No  Is the patient pregnant or possibly pregnant? (Ask all females between the ages of 15-55) ---No  Is this a behavioral health or substance abuse call? ---No     Guidelines    Guideline Title Affirmed Question Affirmed Notes  Eye - Red Without Pus Bleeding on white of the eye    Final Disposition User   See PCP When Office is Open (within 3 days) Raphael Gibney, RN, Vanita Ingles    Comments  appt scheduled for 12/14/2015 at 9:15 am with Dr. Shanon Ace   Disagree/Comply: Comply

## 2015-12-14 ENCOUNTER — Ambulatory Visit (INDEPENDENT_AMBULATORY_CARE_PROVIDER_SITE_OTHER): Payer: 59 | Admitting: Internal Medicine

## 2015-12-14 ENCOUNTER — Encounter: Payer: Self-pay | Admitting: Internal Medicine

## 2015-12-14 VITALS — BP 118/72 | Temp 98.3°F | Wt 142.8 lb

## 2015-12-14 DIAGNOSIS — H1132 Conjunctival hemorrhage, left eye: Secondary | ICD-10-CM

## 2015-12-14 DIAGNOSIS — Z1159 Encounter for screening for other viral diseases: Secondary | ICD-10-CM

## 2015-12-14 LAB — CBC WITH DIFFERENTIAL/PLATELET
BASOS ABS: 0.1 10*3/uL (ref 0.0–0.1)
Basophils Relative: 0.9 % (ref 0.0–3.0)
EOS ABS: 0.1 10*3/uL (ref 0.0–0.7)
Eosinophils Relative: 2.4 % (ref 0.0–5.0)
HCT: 42.9 % (ref 36.0–46.0)
Hemoglobin: 14.5 g/dL (ref 12.0–15.0)
LYMPHS ABS: 1.8 10*3/uL (ref 0.7–4.0)
LYMPHS PCT: 29.5 % (ref 12.0–46.0)
MCHC: 33.8 g/dL (ref 30.0–36.0)
MCV: 102.1 fl — ABNORMAL HIGH (ref 78.0–100.0)
MONOS PCT: 7.1 % (ref 3.0–12.0)
Monocytes Absolute: 0.4 10*3/uL (ref 0.1–1.0)
NEUTROS ABS: 3.8 10*3/uL (ref 1.4–7.7)
NEUTROS PCT: 60.1 % (ref 43.0–77.0)
PLATELETS: 210 10*3/uL (ref 150.0–400.0)
RBC: 4.2 Mil/uL (ref 3.87–5.11)
RDW: 13 % (ref 11.5–15.5)
WBC: 6.2 10*3/uL (ref 4.0–10.5)

## 2015-12-14 LAB — BASIC METABOLIC PANEL
BUN: 13 mg/dL (ref 6–23)
CHLORIDE: 106 meq/L (ref 96–112)
CO2: 31 meq/L (ref 19–32)
CREATININE: 0.85 mg/dL (ref 0.40–1.20)
Calcium: 10.2 mg/dL (ref 8.4–10.5)
GFR: 74.36 mL/min (ref >60.00)
Glucose, Bld: 87 mg/dL (ref 70–99)
Potassium: 4.6 mEq/L (ref 3.5–5.1)
SODIUM: 140 meq/L (ref 135–145)

## 2015-12-14 LAB — TSH: TSH: 1.31 u[IU]/mL (ref 0.35–4.50)

## 2015-12-14 NOTE — Patient Instructions (Signed)
Will notify you  of labs when available. You blood pressure is fine . May want to fu with eye doc as directed but dont think you have HT or serious eye disease causing this.   Let us know if new sx happening .

## 2015-12-15 LAB — HEPATITIS C ANTIBODY: HCV Ab: NEGATIVE

## 2015-12-27 ENCOUNTER — Other Ambulatory Visit: Payer: Self-pay | Admitting: Internal Medicine

## 2015-12-27 NOTE — Telephone Encounter (Signed)
Sent to the pharmacy by e-scribe. 

## 2016-03-05 ENCOUNTER — Telehealth: Payer: Self-pay | Admitting: Family Medicine

## 2016-03-05 ENCOUNTER — Other Ambulatory Visit: Payer: Self-pay | Admitting: Family Medicine

## 2016-03-05 MED ORDER — CITALOPRAM HYDROBROMIDE 10 MG PO TABS: 10.0000 mg | ORAL_TABLET | Freq: Every day | ORAL | 0 refills | Status: DC

## 2016-03-05 NOTE — Telephone Encounter (Signed)
Sent to the pharmacy by e-scribe.  Pt due for 6 month follow up in Nov.  Will send message to scheduling.

## 2016-03-05 NOTE — Telephone Encounter (Signed)
lmom for pt to call back

## 2016-03-05 NOTE — Telephone Encounter (Signed)
Pt is due for medication follow up in Nov.  Please help her to make that appointment.  Thanks!!!

## 2016-03-05 NOTE — Telephone Encounter (Signed)
PT HAS been sch

## 2016-03-29 ENCOUNTER — Encounter: Payer: Self-pay | Admitting: Internal Medicine

## 2016-03-29 ENCOUNTER — Ambulatory Visit (INDEPENDENT_AMBULATORY_CARE_PROVIDER_SITE_OTHER): Payer: 59 | Admitting: Internal Medicine

## 2016-03-29 ENCOUNTER — Other Ambulatory Visit: Payer: Self-pay | Admitting: Family Medicine

## 2016-03-29 ENCOUNTER — Ambulatory Visit (HOSPITAL_COMMUNITY)
Admission: RE | Admit: 2016-03-29 | Discharge: 2016-03-29 | Disposition: A | Discharge status: Home / Self Care | Payer: 59 | Source: Ambulatory Visit | Attending: Cardiology | Admitting: Cardiology

## 2016-03-29 ENCOUNTER — Ambulatory Visit (INDEPENDENT_AMBULATORY_CARE_PROVIDER_SITE_OTHER)
Admission: RE | Admit: 2016-03-29 | Discharge: 2016-03-29 | Disposition: A | Discharge status: Home / Self Care | Payer: 59 | Source: Ambulatory Visit | Attending: Internal Medicine | Admitting: Internal Medicine

## 2016-03-29 VITALS — BP 130/80 | Temp 98.3°F | Wt 141.6 lb

## 2016-03-29 DIAGNOSIS — M79604 Pain in right leg: Secondary | ICD-10-CM | POA: Diagnosis not present

## 2016-03-29 DIAGNOSIS — S81811A Laceration without foreign body, right lower leg, initial encounter: Secondary | ICD-10-CM | POA: Diagnosis not present

## 2016-03-29 DIAGNOSIS — W19XXXA Unspecified fall, initial encounter: Secondary | ICD-10-CM | POA: Diagnosis not present

## 2016-03-29 DIAGNOSIS — S8011XA Contusion of right lower leg, initial encounter: Secondary | ICD-10-CM

## 2016-03-29 DIAGNOSIS — X58XXXA Exposure to other specified factors, initial encounter: Secondary | ICD-10-CM | POA: Diagnosis not present

## 2016-03-29 MED ORDER — DOXYCYCLINE HYCLATE 100 MG PO TABS: 100.0000 mg | ORAL_TABLET | Freq: Two times a day (BID) | ORAL | 0 refills | Status: DC

## 2016-03-29 NOTE — Patient Instructions (Addendum)
Checking x-ray of your ankle lower leg and a Doppler test to check for blood  clot. You have some contusion or injury to the soft tissue under the cuts. I agree the pain should be significantly improved by 2 weeks out. It doesn't really look like infection but if the other evaluation doesn't explain the pain  May decide to begin antibiotic    . Elevation   advil ok local care   .you may have a medial ankle sprain injury and   Depending on how you are doing  Consider seeing sports medicine .   Plan FU after testing

## 2016-03-29 NOTE — Progress Notes (Signed)
Pre visit review using our clinic review tool, if applicable. No additional management support is needed unless otherwise documented below in the visit note. 

## 2016-03-29 NOTE — Progress Notes (Signed)
Chief Complaint  Patient presents with  . Rt Foot Bruising and Swelling    Fell on [place] 03/17/16  . Rt Leg Cuts    HPI: Jessica Cooper 55 y.o. comes in today after a fall 2 weeks ago. She was pulling out toes putting back in her garden and there was a break structure behind her when she stepped back she fell into it and back. She had abrasions and pain on her right lower extremity but was able to walk. She cleaned it and put Polysporin on it and covered it for couple days and then let it open to scab. She's had some pain in her medial foot and lower extremity that time and felt that significant pain continued for 2 weeks so decided to come in. She is able to walk but feels that her calf is feeling a bit funny. She is up-to-date on tetanus shot. There is no oozing or streaking of the wound. ROS: See pertinent positives and negatives per HPI.  Past Medical History:  Diagnosis Date  . Anxiety   . 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 Depression   . 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 Hyperlipidemia    runs around 245 - diet controlled, no meds    Family History  Problem Relation Age of Onset  . Heart failure Father     heart valve problem   . Hypertension Father   . Breast cancer Sister   . Crohn's disease Sister   . Heart failure Maternal Grandmother   . Colon cancer Neg Hx   . Esophageal cancer Neg Hx   . Rectal cancer Neg Hx   . Stomach cancer Neg Hx     Social History   Social History  . Marital status: Married    Spouse name: N/A  . Number of children: N/A  . Years of education: N/A   Social History Main Topics  . Smoking status: Current Every Day Smoker    Packs/day: 1.00    Years: 30.00    Types: Cigarettes  . Smokeless tobacco: Never Used  . Alcohol use 4.2 oz/week    7 Glasses of wine per week  . Drug use: No  . Sexual activity: Yes    Birth control/ protection: Surgical     Comment: Abd Hysterectomy    Other Topics Concern  . None   Social History Narrative   Occupation: Research scientist (medical)   Married   Regular exercise- no   Smoker    HH of 2   5 pets  2 dogs and cats.  One outside cat.    Moved from [place] from job   G0P0    Outpatient Medications Prior to Visit  Medication Sig Dispense Refill  . Biotin 10 MG TABS Take by mouth. Reported on 09/20/2015    . cholecalciferol (VITAMIN D) 1000 UNITS tablet Take 4,000 Units by mouth daily.     . citalopram (CELEXA) 10 MG tablet Take 1 tablet (10 mg total) by mouth daily. 90 tablet 0  . COD LIVER OIL PO Take by mouth.      Marland Kitchen glucosamine-chondroitin 500-400 MG tablet Take 1 tablet by mouth 3 (three) times daily.      . MULTIPLE VITAMIN PO Take by mouth. Reported on 09/20/2015    . Probiotic Product (ALIGN) 4 MG CAPS Take by mouth.      . mirtazapine (REMERON) 15 MG tablet  Take 0.5-1 tablets (7.5-15 mg total) by mouth at bedtime. 30 tablet 5   No facility-administered medications prior to visit.      EXAM:  BP 130/80 (BP Location: Right Arm, Patient Position: Sitting, Cuff Size: Normal)   Temp 98.3 F (36.8 C) (Oral)   Wt 141 lb 9.6 oz (64.2 kg)   BMI 23.56 kg/m   Body mass index is 23.56 kg/m.  GENERAL: vitals reviewed and listed above, alert, oriented, appears well hydrated and in no acute distress HEENT: atraumatic, conjunctiva  clear, no obvious abnormalities on inspection of external nose and ears    MS: moves all extremities   Right lower extremity with 3 linear lacerations scabbed over. The largest one is about 5 cm that she says was quite deep. There is 4 mm of erythema surrounding without pus or abscess. There is some subcutaneous bruising contusion in the calf and the lateral medial pre-Achilles area. Question bony tenderness on the medial ankle. There is bruising dependent possibly on her arch of her foot. However there is some tenderness at the plantar fascia. No other deformity. Neurovascular appears  intact. She is able to walk fairly easily but the otherwise the areas quite tender to touch. PSYCH: pleasant and cooperative, no obvious depression or anxiety  ASSESSMENT AND PLAN:  Discussed the following assessment and plan:  Right leg pain - Plan: VAS [place] LOWER EXTREMITY VENOUS (DVT), DG Ankle Complete Right, DG Tibia/Fibula Right  Contusion of multiple sites of right lower extremity, initial encounter - Plan: VAS [place] LOWER EXTREMITY VENOUS (DVT), DG Ankle Complete Right, DG Tibia/Fibula Right  Laceration of skin of right lower leg, initial encounter  Accidental fall, initial encounter Suspects is significant contusions and possibly even medial ankle strain sprain. Expect to have had much less pain at this time 2 weeks out from the injury. Doesn't really look like series infection however  Because of the sig pain may add antibiotic after imaging evaluation.  -Patient advised to return or notify health care team  if symptoms worsen ,persist or new concerns arise.  Patient Instructions  Checking x-ray of your ankle lower leg and a Doppler test to check for blood  clot. You have some contusion or injury to the soft tissue under the cuts. I agree the pain should be significantly improved by 2 weeks out. It doesn't really look like infection but if the other evaluation doesn't explain the pain  May decide to begin antibiotic    . Elevation   advil ok local care   .you may have a medial ankle sprain injury and   Depending on how you are doing  Consider seeing sports medicine .   Plan FU after testing     Mariann Laster K. Rena Sweeden M.D.

## 2016-04-07 NOTE — Progress Notes (Signed)
Pre visit review using our clinic review tool, if applicable. No additional management support is needed unless otherwise documented below in the visit note.  Chief Complaint  Patient presents with  . Follow-up    HPI: Jessica Cooper 55 y.o.  Fu   Pain wound hematoma contusion  and secondary infection  Doxycycline gave her some headache and nausea but she finished it. There is a lot less pain and swelling although slight oozing at the large abrasion.  She's able to walk normally now.  Sleep: She took the Remeron which helped for sleep and after a while became resistant to it and then got food cravings so she stopped it.  Citalopram 10 mg this worked well for her for a number of years. 20 mg made her too flat. The reason she is taking it is anxiety and it had a panic attack chest pain center to the emergency room before she was put on the medication.  The x-ray of her leg mentioned vascular calcifications she has been taking 5000 units of vitamin D ROS: See pertinent positives and negatives per HPI.  Past Medical History:  Diagnosis Date  . Anxiety   . 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 Depression   . 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 Hyperlipidemia    runs around 245 - diet controlled, no meds    Family History  Problem Relation Age of Onset  . Heart failure Father     heart valve problem   . Hypertension Father   . Breast cancer Sister   . Crohn's disease Sister   . Heart failure Maternal Grandmother   . Colon cancer Neg Hx   . Esophageal cancer Neg Hx   . Rectal cancer Neg Hx   . Stomach cancer Neg Hx     Social History   Social History  . Marital status: Married    Spouse name: N/A  . Number of children: N/A  . Years of education: N/A   Social History Main Topics  . Smoking status: Current Every Day Smoker    Packs/day: 1.00    Years: 30.00    Types: Cigarettes  . Smokeless tobacco: Never Used  . Alcohol  use 4.2 oz/week    7 Glasses of wine per week  . Drug use: No  . Sexual activity: Yes    Birth control/ protection: Surgical     Comment: Abd Hysterectomy   Other Topics Concern  . None   Social History Narrative   Occupation: Research scientist (medical)   Married   Regular exercise- no   Smoker    HH of 2   5 pets  2 dogs and cats.  One outside cat.    Moved from [place] from job   G0P0    Outpatient Medications Prior to Visit  Medication Sig Dispense Refill  . Biotin 10 MG TABS Take by mouth. Reported on 09/20/2015    . citalopram (CELEXA) 10 MG tablet Take 1 tablet (10 mg total) by mouth daily. 90 tablet 0  . COD LIVER OIL PO Take by mouth.      Marland Kitchen glucosamine-chondroitin 500-400 MG tablet Take 1 tablet by mouth 3 (three) times daily.      . MULTIPLE VITAMIN PO Take by mouth. Reported on 09/20/2015    . Probiotic Product (ALIGN) 4 MG CAPS Take by mouth.      . cholecalciferol (VITAMIN D) 1000 UNITS  tablet Take 4,000 Units by mouth daily.     Marland Kitchen doxycycline (VIBRA-TABS) 100 MG tablet Take 1 tablet (100 mg total) by mouth 2 (two) times daily. 14 tablet 0   No facility-administered medications prior to visit.      EXAM:  BP 128/68 (BP Location: Right Arm, Patient Position: Sitting, Cuff Size: Normal)   Temp 98.2 F (36.8 C) (Oral)   Wt 140 lb (63.5 kg)   BMI 23.30 kg/m   Body mass index is 23.3 kg/m.  GENERAL: vitals reviewed and listed above, alert, oriented, appears well hydrated and in no acute distress HEENT: atraumatic, conjunctiva  clear, no obvious abnormalities on inspection of external nose and ears CV: HRRR, no clubbing cyanosis or  peripheral edema nl cap refill  Left lower extremity much improved without deformity bruising almost gone largest laceration has much less redness and minimal exudate no abscess no mass effect. Pulses present in that extremity. MS: moves all extremities without noticeable focal  abnormality PSYCH: pleasant and cooperative, no  obvious depression  No excessive anxiety   ASSESSMENT AND PLAN:  Discussed the following assessment and plan:  Infected wound - much improved  continue local care  Right leg pain - Plan: VAS [place] ABI WITH/WO TBI  Vascular calcification - incidental finding on x ray lle.  plan abi after injury  healed consdier  other intervnetion no  vasc sx( positional  tingling ) - Plan: VAS [place] ABI WITH/WO TBI  Medication management  Insomnia, unspecified type - trial trazadone no underlying qt hx abnd this at low dose benefit more than risk as a trial  -Patient advised to return or notify health care team  if symptoms worsen ,persist or new concerns arise.  Patient Instructions  Local care   I think your leg looks a lot better  Continue covering and care .   Ok to continue  Citalopram 10 mg as discussed.  Can try  trazadone  loiw dose for sleep if helps .  At this low dose    Should be ok . There is potential interaction at   Higher doses   In NOvmenber will arrange  A vascular study of your arteries   To make sure circulation is ok    considertaion  Of  Cholesterol  treatment . Will review  record   Vit d  Decrease to 1000  Per day  For now    [place] K. Larance Ratledge M.D.

## 2016-04-08 ENCOUNTER — Ambulatory Visit (INDEPENDENT_AMBULATORY_CARE_PROVIDER_SITE_OTHER): Payer: 59 | Admitting: Internal Medicine

## 2016-04-08 ENCOUNTER — Encounter: Payer: Self-pay | Admitting: Internal Medicine

## 2016-04-08 VITALS — BP 128/68 | Temp 98.2°F | Wt 140.0 lb

## 2016-04-08 DIAGNOSIS — T148XXA Other injury of unspecified body region, initial encounter: Secondary | ICD-10-CM | POA: Diagnosis not present

## 2016-04-08 DIAGNOSIS — G4700 Insomnia, unspecified: Secondary | ICD-10-CM

## 2016-04-08 DIAGNOSIS — I998 Other disorder of circulatory system: Secondary | ICD-10-CM

## 2016-04-08 DIAGNOSIS — M79604 Pain in right leg: Secondary | ICD-10-CM

## 2016-04-08 DIAGNOSIS — Z79899 Other long term (current) drug therapy: Secondary | ICD-10-CM

## 2016-04-08 DIAGNOSIS — L089 Local infection of the skin and subcutaneous tissue, unspecified: Secondary | ICD-10-CM

## 2016-04-08 MED ORDER — TRAZODONE HCL 50 MG PO TABS: 25.0000 mg | ORAL_TABLET | Freq: Every evening | ORAL | 3 refills | Status: DC | PRN

## 2016-04-08 NOTE — Patient Instructions (Addendum)
Local care   I think your leg looks a lot better  Continue covering and care .   Ok to continue  Citalopram 10 mg as discussed.  Can try  trazadone  loiw dose for sleep if helps .  At this low dose    Should be ok . There is potential interaction at   Higher doses   In NOvmenber will arrange  A vascular study of your arteries   To make sure circulation is ok    considertaion  Of  Cholesterol  treatment . Will review  record   Vit d  Decrease to 1000  Per day  For now

## 2016-05-01 ENCOUNTER — Encounter: Payer: Self-pay | Admitting: Internal Medicine

## 2016-05-02 LAB — HEPATIC FUNCTION PANEL
ALK PHOS: 66 U/L (ref 25–125)
ALT: 11 U/L (ref 7–35)
AST: 17 U/L (ref 13–35)
Bilirubin, Total: 0.5 mg/dL

## 2016-05-02 LAB — LIPID PANEL
CHOLESTEROL: 267 mg/dL — AB (ref 0–200)
HDL: 75 mg/dL — AB (ref 35–70)
LDL CALC: 166 mg/dL
LDl/HDL Ratio: 2.2
TRIGLYCERIDES: 132 mg/dL (ref 40–160)

## 2016-05-02 LAB — BASIC METABOLIC PANEL
BUN: 9 mg/dL (ref 4–21)
Creatinine: 0.8 mg/dL (ref <=1.1)
GLUCOSE: 98 mg/dL
Potassium: 4.8 mmol/L (ref 3.4–5.3)
Sodium: 144 mmol/L (ref 137–147)

## 2016-05-03 NOTE — Telephone Encounter (Signed)
Misty please help   I see my order from the Office visit but dont see ir otherwise .  Please have someone advise patient about this procedure appt.   Thanks  [facility]

## 2016-05-06 ENCOUNTER — Other Ambulatory Visit: Payer: Self-pay | Admitting: Family Medicine

## 2016-05-06 DIAGNOSIS — I998 Other disorder of circulatory system: Secondary | ICD-10-CM

## 2016-05-06 NOTE — Progress Notes (Signed)
VAS [place] ABI WITH/WO TBI

## 2016-05-07 ENCOUNTER — Ambulatory Visit: Payer: 59 | Admitting: Internal Medicine

## 2016-06-02 ENCOUNTER — Encounter: Payer: Self-pay | Admitting: Internal Medicine

## 2016-06-03 ENCOUNTER — Encounter: Payer: Self-pay | Admitting: Family Medicine

## 2016-06-05 ENCOUNTER — Other Ambulatory Visit: Payer: Self-pay | Admitting: Internal Medicine

## 2016-06-05 DIAGNOSIS — I998 Other disorder of circulatory system: Secondary | ICD-10-CM

## 2016-06-12 ENCOUNTER — Other Ambulatory Visit: Payer: Self-pay | Admitting: Family Medicine

## 2016-06-13 ENCOUNTER — Ambulatory Visit (HOSPITAL_COMMUNITY)
Admission: RE | Admit: 2016-06-13 | Discharge: 2016-06-13 | Disposition: A | Discharge status: Home / Self Care | Payer: 59 | Source: Ambulatory Visit | Attending: Cardiology | Admitting: Cardiology

## 2016-06-13 DIAGNOSIS — I739 Peripheral vascular disease, unspecified: Secondary | ICD-10-CM | POA: Diagnosis not present

## 2016-06-13 DIAGNOSIS — I998 Other disorder of circulatory system: Secondary | ICD-10-CM | POA: Diagnosis present

## 2016-06-14 MED ORDER — CITALOPRAM HYDROBROMIDE 10 MG PO TABS: 10.0000 mg | ORAL_TABLET | Freq: Every day | ORAL | 1 refills | Status: DC

## 2016-06-14 NOTE — Telephone Encounter (Signed)
Ok to refill x 6 months   Ov  Before runs out

## 2016-08-20 ENCOUNTER — Encounter: Payer: Self-pay | Admitting: Internal Medicine

## 2016-08-20 DIAGNOSIS — I998 Other disorder of circulatory system: Secondary | ICD-10-CM | POA: Insufficient documentation

## 2016-09-20 DIAGNOSIS — Z1231 Encounter for screening mammogram for malignant neoplasm of breast: Secondary | ICD-10-CM | POA: Diagnosis not present

## 2016-09-25 DIAGNOSIS — Z1231 Encounter for screening mammogram for malignant neoplasm of breast: Secondary | ICD-10-CM | POA: Diagnosis not present

## 2016-11-25 ENCOUNTER — Encounter: Payer: Self-pay | Admitting: Internal Medicine

## 2016-11-28 NOTE — Telephone Encounter (Signed)
Jessica Cooper please be aware of message  And help her make appt if needed

## 2016-12-20 NOTE — Progress Notes (Signed)
Chief Complaint  Patient presents with  . Flank Pain    6 months     HPI: Jessica Cooper 56 y.o. come in for   Concern about ruq  Aching  soreness   ruq aches  Continuously    5- 6 mos ago. Mostly continuously    No aggravators   Not interfere with sleep.  Sis with crohns    Fraser Din thinks has some ibs sx  Alternating  Constipation or loose   Poss lactose intolerant.  Lots of gurgling.     No pain .   No fever weight loss    No meds for stomach .  Had right middle finger mid joint pain without injury  Is right handed  ROS: See pertinent positives and negatives per HPI. No cp sob syncope . No blood in stool uti sx .   Past Medical History:  Diagnosis Date  . Anxiety   . 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 Depression   . 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 Hyperlipidemia    runs around 245 - diet controlled, no meds    Family History  Problem Relation Age of Onset  . Heart failure Father        heart valve problem   . Hypertension Father   . Breast cancer Sister   . Crohn's disease Sister   . Heart failure Maternal Grandmother   . Colon cancer Neg Hx   . Esophageal cancer Neg Hx   . Rectal cancer Neg Hx   . Stomach cancer Neg Hx     Social History   Social History  . Marital status: Married    Spouse name: N/A  . Number of children: N/A  . Years of education: N/A   Social History Main Topics  . Smoking status: Current Every Day Smoker    Packs/day: 1.00    Years: 30.00    Types: Cigarettes  . Smokeless tobacco: Never Used  . Alcohol use 4.2 oz/week    7 Glasses of wine per week  . Drug use: No  . Sexual activity: Yes    Birth control/ protection: Surgical     Comment: Abd Hysterectomy   Other Topics Concern  . None   Social History Narrative   Occupation: Research scientist (medical)   Married   Regular exercise- no   Smoker    HH of 2   5 pets  2 dogs and cats.  One outside cat.    Moved from [place]  from job   G0P0    Outpatient Medications Prior to Visit  Medication Sig Dispense Refill  . Biotin 10 MG TABS Take by mouth. Reported on 09/20/2015    . Cholecalciferol (VITAMIN D3 PO) Take 5,000 Units by mouth daily.    . citalopram (CELEXA) 10 MG tablet Take 1 tablet (10 mg total) by mouth daily. 90 tablet 1  . COD LIVER OIL PO Take by mouth.      Marland Kitchen glucosamine-chondroitin 500-400 MG tablet Take 1 tablet by mouth 3 (three) times daily.      . MULTIPLE VITAMIN PO Take by mouth. Reported on 09/20/2015    . Probiotic Product (ALIGN) 4 MG CAPS Take by mouth.      . traZODone (DESYREL) 50 MG tablet Take 0.5-1 tablets (25-50 mg total) by mouth at bedtime as needed for sleep. (Patient not taking: Reported on 12/23/2016) 30 tablet 3  No facility-administered medications prior to visit.      EXAM:  BP 120/70 (BP Location: Right Arm, Patient Position: Sitting, Cuff Size: Normal)   Pulse (!) 56   Temp 98.1 F (36.7 C) (Oral)   Wt 138 lb 9.6 oz (62.9 kg)   BMI 23.06 kg/m   Body mass index is 23.06 kg/m.  GENERAL: vitals reviewed and listed above, alert, oriented, appears well hydrated and in no acute distress HEENT: atraumatic, conjunctiva  clear, no obvious abnormalities on inspection of external nose and ears NECK: no obvious masses on inspection palpation  LUNGS: clear to auscultation bilaterally, no wheezes, rales or rhonchi, good air movement CV: HRRR, no clubbing cyanosis or  peripheral edema nl cap refill  Abdomen:  Sof,t normal bowel sounds without hepatosplenomegaly, no guarding rebound or masses no CVA tenderness tedner ness ruq just below liver  Noted  MS: moves all extremities without noticeable focal  Abnormality right middle finger with pip nodules ? heberden's no inflammatory swelling redness or warmth  PSYCH: pleasant and cooperative, no obvious depression or anxiety Lab Results  Component Value Date   WBC 5.4 12/23/2016   HGB 14.0 12/23/2016   HCT 40.0 12/23/2016   PLT  201.0 12/23/2016   GLUCOSE 107 (H) 12/23/2016   CHOL 239 (H) 12/23/2016   TRIG 102.0 12/23/2016   HDL 66.90 12/23/2016   LDLDIRECT 178.9 09/18/2012   LDLCALC 151 (H) 12/23/2016   ALT 15 12/23/2016   AST 17 12/23/2016   NA 142 12/23/2016   K 4.5 12/23/2016   CL 108 12/23/2016   CREATININE 0.78 12/23/2016   BUN 10 12/23/2016   CO2 27 12/23/2016   TSH 1.31 12/14/2015   INR 1.0 01/04/2013   HGBA1C 5.1 12/06/2014   BP Readings from Last 3 Encounters:  12/23/16 120/70  04/08/16 128/68  03/29/16 130/80    ASSESSMENT AND PLAN:  Discussed the following assessment and plan:  RUQ discomfort - Plan: CBC with Differential/Platelet, Sedimentation rate, POCT Urinalysis Dipstick (Automated), Lipid panel, Hepatic function panel, Basic metabolic panel, Celiac Disease Comprehensive Panel with Reflexes, US Abdomen Complete  Hyperlipidemia, unspecified hyperlipidemia type - Plan: CBC with Differential/Platelet, Sedimentation rate, POCT Urinalysis Dipstick (Automated), Lipid panel, Hepatic function panel, Basic metabolic panel, Celiac Disease Comprehensive Panel with Reflexes, Ambulatory referral to Cardiology  Vascular calcification - Plan: CBC with Differential/Platelet, Sedimentation rate, POCT Urinalysis Dipstick (Automated), Lipid panel, Hepatic function panel, Basic metabolic panel, Celiac Disease Comprehensive Panel with Reflexes, Ambulatory referral to Cardiology  TOBACCO USE - Plan: CBC with Differential/Platelet, Sedimentation rate, POCT Urinalysis Dipstick (Automated), Lipid panel, Hepatic function panel, Basic metabolic panel, Celiac Disease Comprehensive Panel with Reflexes  GI symptoms - Plan: CBC with Differential/Platelet, Sedimentation rate, POCT Urinalysis Dipstick (Automated), Lipid panel, Hepatic function panel, Basic metabolic panel, Celiac Disease Comprehensive Panel with Reflexes, US Abdomen Complete  Pain of finger, unspecified laterality Finger  Pain may be djd vs other   Buddy tape  limit etoh  Pt aware ,  Tobacco advised stopping and pat aware.  Gi s hx of same  ? If ibs but fam hx IBD and  At risk   cv risk  May benefit from  Statin med .  etc -Patient advised to return or notify health care team  if  new concerns arise.  Patient Instructions  Will notify you  of labs when available.   May get  Dr Hilarie Fredrickson to see you   And also cardiology for risk assessment but you may benefit from a  cholesterol reducing medicine in addition to stopping tobacco .   Limit alcohol to 7 per week  To reduce   Risk.  Plan fu depending on all results     Standley Brooking. Reghan Thul M.D.

## 2016-12-23 ENCOUNTER — Ambulatory Visit (INDEPENDENT_AMBULATORY_CARE_PROVIDER_SITE_OTHER): Payer: 59 | Admitting: Internal Medicine

## 2016-12-23 ENCOUNTER — Encounter: Payer: Self-pay | Admitting: Internal Medicine

## 2016-12-23 VITALS — BP 120/70 | HR 56 | Temp 98.1°F | Wt 138.6 lb

## 2016-12-23 DIAGNOSIS — I998 Other disorder of circulatory system: Secondary | ICD-10-CM | POA: Diagnosis not present

## 2016-12-23 DIAGNOSIS — F17200 Nicotine dependence, unspecified, uncomplicated: Secondary | ICD-10-CM | POA: Diagnosis not present

## 2016-12-23 DIAGNOSIS — E785 Hyperlipidemia, unspecified: Secondary | ICD-10-CM | POA: Diagnosis not present

## 2016-12-23 DIAGNOSIS — R1011 Right upper quadrant pain: Secondary | ICD-10-CM | POA: Diagnosis not present

## 2016-12-23 DIAGNOSIS — R198 Other specified symptoms and signs involving the digestive system and abdomen: Secondary | ICD-10-CM | POA: Diagnosis not present

## 2016-12-23 DIAGNOSIS — M79646 Pain in unspecified finger(s): Secondary | ICD-10-CM | POA: Diagnosis not present

## 2016-12-23 LAB — BASIC METABOLIC PANEL
BUN: 10 mg/dL (ref 6–23)
CHLORIDE: 108 meq/L (ref 96–112)
CO2: 27 mEq/L (ref 19–32)
Calcium: 9.9 mg/dL (ref 8.4–10.5)
Creatinine, Ser: 0.78 mg/dL (ref 0.40–1.20)
GFR: 81.79 mL/min (ref >60.00)
GLUCOSE: 107 mg/dL — AB (ref 70–99)
POTASSIUM: 4.5 meq/L (ref 3.5–5.1)
SODIUM: 142 meq/L (ref 135–145)

## 2016-12-23 LAB — CBC WITH DIFFERENTIAL/PLATELET
BASOS PCT: 1 % (ref 0.0–3.0)
Basophils Absolute: 0.1 10*3/uL (ref 0.0–0.1)
Eosinophils Absolute: 0.2 10*3/uL (ref 0.0–0.7)
Eosinophils Relative: 3 % (ref 0.0–5.0)
HCT: 40 % (ref 36.0–46.0)
Hemoglobin: 14 g/dL (ref 12.0–15.0)
LYMPHS ABS: 1.8 10*3/uL (ref 0.7–4.0)
LYMPHS PCT: 33.1 % (ref 12.0–46.0)
MCHC: 34.9 g/dL (ref 30.0–36.0)
MCV: 103 fl — AB (ref 78.0–100.0)
MONOS PCT: 7.3 % (ref 3.0–12.0)
Monocytes Absolute: 0.4 10*3/uL (ref 0.1–1.0)
NEUTROS ABS: 3 10*3/uL (ref 1.4–7.7)
Neutrophils Relative %: 55.6 % (ref 43.0–77.0)
PLATELETS: 201 10*3/uL (ref 150.0–400.0)
RBC: 3.88 Mil/uL (ref 3.87–5.11)
RDW: 12.5 % (ref 11.5–15.5)
WBC: 5.4 10*3/uL (ref 4.0–10.5)

## 2016-12-23 LAB — HEPATIC FUNCTION PANEL
ALT: 15 U/L (ref 0–35)
AST: 17 U/L (ref 0–37)
Albumin: 4.3 g/dL (ref 3.5–5.2)
Alkaline Phosphatase: 66 U/L (ref 39–117)
BILIRUBIN TOTAL: 0.6 mg/dL (ref 0.2–1.2)
Bilirubin, Direct: 0.1 mg/dL (ref 0.0–0.3)
Total Protein: 6.6 g/dL (ref 6.0–8.3)

## 2016-12-23 LAB — LIPID PANEL
CHOL/HDL RATIO: 4
CHOLESTEROL: 239 mg/dL — AB (ref 0–200)
HDL: 66.9 mg/dL (ref >39.00)
LDL CALC: 151 mg/dL — AB (ref 0–99)
NONHDL: 171.63
Triglycerides: 102 mg/dL (ref 0.0–149.0)
VLDL: 20.4 mg/dL (ref 0.0–40.0)

## 2016-12-23 LAB — POC URINALSYSI DIPSTICK (AUTOMATED)
BILIRUBIN UA: NEGATIVE
Blood, UA: NEGATIVE
GLUCOSE UA: NEGATIVE
KETONES UA: NEGATIVE
Leukocytes, UA: NEGATIVE
Nitrite, UA: NEGATIVE
Protein, UA: NEGATIVE
SPEC GRAV UA: 1.01 (ref 1.010–1.025)
Urobilinogen, UA: 0.2 E.U./dL
pH, UA: 6 (ref 5.0–8.0)

## 2016-12-23 LAB — SEDIMENTATION RATE: Sed Rate: 1 mm/hr (ref 0–30)

## 2016-12-23 NOTE — Patient Instructions (Addendum)
Will notify you  of labs when available.   May get  Dr Hilarie Fredrickson to see you   And also cardiology for risk assessment but you may benefit from a cholesterol reducing medicine in addition to stopping tobacco .   Limit alcohol to 7 per week  To reduce   Risk.  Plan fu depending on all results

## 2016-12-24 LAB — CELIAC DISEASE COMPREHENSIVE PANEL WITH REFLEXES
IGA: 81 mg/dL (ref 81–463)
TISSUE TRANSGLUTAMINASE AB, IGA: 1 U/mL (ref <4)

## 2016-12-31 ENCOUNTER — Ambulatory Visit
Admission: RE | Admit: 2016-12-31 | Discharge: 2016-12-31 | Disposition: A | Discharge status: Home / Self Care | Payer: 59 | Source: Ambulatory Visit | Attending: Internal Medicine | Admitting: Internal Medicine

## 2016-12-31 DIAGNOSIS — R198 Other specified symptoms and signs involving the digestive system and abdomen: Secondary | ICD-10-CM

## 2016-12-31 DIAGNOSIS — R1011 Right upper quadrant pain: Secondary | ICD-10-CM

## 2016-12-31 DIAGNOSIS — N281 Cyst of kidney, acquired: Secondary | ICD-10-CM | POA: Diagnosis not present

## 2017-01-01 ENCOUNTER — Other Ambulatory Visit: Payer: Self-pay | Admitting: Emergency Medicine

## 2017-01-01 DIAGNOSIS — R109 Unspecified abdominal pain: Secondary | ICD-10-CM

## 2017-01-01 DIAGNOSIS — N281 Cyst of kidney, acquired: Secondary | ICD-10-CM

## 2017-01-03 ENCOUNTER — Other Ambulatory Visit: Payer: Self-pay | Admitting: Emergency Medicine

## 2017-01-03 DIAGNOSIS — E785 Hyperlipidemia, unspecified: Secondary | ICD-10-CM

## 2017-01-03 MED ORDER — ATORVASTATIN CALCIUM 20 MG PO TABS: 20.0000 mg | ORAL_TABLET | Freq: Every day | ORAL | 3 refills | Status: DC

## 2017-01-04 ENCOUNTER — Other Ambulatory Visit: Payer: Self-pay | Admitting: Internal Medicine

## 2017-01-29 DIAGNOSIS — R1011 Right upper quadrant pain: Secondary | ICD-10-CM | POA: Diagnosis not present

## 2017-01-29 DIAGNOSIS — N281 Cyst of kidney, acquired: Secondary | ICD-10-CM | POA: Diagnosis not present

## 2017-01-30 ENCOUNTER — Other Ambulatory Visit: Payer: Self-pay | Admitting: Urology

## 2017-01-30 ENCOUNTER — Other Ambulatory Visit: Payer: Self-pay

## 2017-01-30 DIAGNOSIS — N281 Cyst of kidney, acquired: Secondary | ICD-10-CM

## 2017-02-04 ENCOUNTER — Ambulatory Visit
Admission: RE | Admit: 2017-02-04 | Discharge: 2017-02-04 | Disposition: A | Discharge status: Home / Self Care | Payer: 59 | Source: Ambulatory Visit | Attending: Urology | Admitting: Urology

## 2017-02-04 ENCOUNTER — Other Ambulatory Visit (HOSPITAL_COMMUNITY): Payer: Self-pay | Admitting: Interventional Radiology

## 2017-02-04 DIAGNOSIS — N281 Cyst of kidney, acquired: Secondary | ICD-10-CM | POA: Diagnosis not present

## 2017-02-04 HISTORY — PX: IR RADIOLOGIST EVAL & MGMT: IMG5224

## 2017-02-04 NOTE — Consult Note (Signed)
Chief Complaint: Right renal cyst, right flank pain   Referring Physician(s): Bell,Eugene D III  History of Present Illness: Jessica Cooper is a 56 y.o. female with nonspecific vague right upper quadrant abdominal and flank pain. She was seen by her primary care physician. Workup has been negative. Abdominal ultrasound was performed. This demonstrated a 7.1 cm right upper pole simple renal cyst. She presents today to discuss ultrasound aspiration of the cyst. She describes mild intermittent right upper quadrant abdominal flank pain. No associated nausea, vomiting, or diarrhea. No interval fevers. No hematuria or dysuria.  Past Medical History:  Diagnosis Date  . Anxiety   . 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 Depression   . 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 Hyperlipidemia    runs around 245 - diet controlled, no meds    Past Surgical History:  Procedure Laterality Date  . ABDOMINAL HYSTERECTOMY  08/2008   fibroid bleeding  . BREAST BIOPSY Right    Aug 2015-"tracker' placed for hyperplasia cells  . CERVICAL CONE BIOPSY    . WISDOM TOOTH EXTRACTION      Allergies: Metronidazole  Medications: Prior to Admission medications   Medication Sig Start Date End Date Taking? Authorizing Provider  atorvastatin (LIPITOR) 20 MG tablet Take 1 tablet (20 mg total) by mouth daily. 01/03/17  Yes Panosh, Standley Brooking, MD  Biotin 10 MG TABS Take by mouth. Reported on 09/20/2015   Yes [provider]  Cholecalciferol (VITAMIN D3 PO) Take 5,000 Units by mouth daily.   Yes [provider]  citalopram (CELEXA) 10 MG tablet TAKE 1 TABLET BY MOUTH  DAILY 01/06/17  Yes Panosh, Standley Brooking, MD  COD LIVER OIL PO Take by mouth.     Yes [provider]  glucosamine-chondroitin 500-400 MG tablet Take 1 tablet by mouth 3 (three) times daily.     Yes [provider]  MULTIPLE VITAMIN PO Take by mouth. Reported on 09/20/2015    Yes [provider]  Probiotic Product (ALIGN) 4 MG CAPS Take by mouth.     Yes [provider]  traZODone (DESYREL) 50 MG tablet Take 0.5-1 tablets (25-50 mg total) by mouth at bedtime as needed for sleep. Patient not taking: Reported on 12/23/2016 04/08/16   Panosh, Standley Brooking, MD     Family History  Problem Relation Age of Onset  . Heart failure Father        heart valve problem   . Hypertension Father   . Breast cancer Sister   . Crohn's disease Sister   . Heart failure Maternal Grandmother   . Colon cancer Neg Hx   . Esophageal cancer Neg Hx   . Rectal cancer Neg Hx   . Stomach cancer Neg Hx     Social History   Social History  . Marital status: Married    Spouse name: N/A  . Number of children: N/A  . Years of education: N/A   Social History Main Topics  . Smoking status: Current Every Day Smoker    Packs/day: 1.00    Years: 30.00    Types: Cigarettes  . Smokeless tobacco: Never Used  . Alcohol use 4.2 oz/week    7 Glasses of wine per week  . Drug use: No  . Sexual activity: Yes    Birth control/ protection: Surgical     Comment: Abd Hysterectomy   Other Topics Concern  . Not on  file   Social History Narrative   Occupation: Chiropodist Degree   Married   Regular exercise- no   Smoker    HH of 2   5 pets  2 dogs and cats.  One outside cat.    Moved from [place] from job   G0P0     Review of Systems: A 12 point ROS discussed and pertinent positives are indicated in the HPI above.  All other systems are negative.  Review of Systems  Vital Signs: BP 118/80   Pulse 71   Temp 98.1 F (36.7 C) (Oral)   Resp 14   Ht 5\' 5"  (1.651 m)   Wt 137 lb (62.1 kg)   SpO2 99%   BMI 22.80 kg/m   Physical Exam  Constitutional: She is oriented to person, place, and time. She appears well-developed and well-nourished. No distress.  Eyes: Conjunctivae are normal. No scleral icterus.  Neurological: She is alert and oriented to person,  place, and time.  Skin: Skin is warm and dry. She is not diaphoretic. No erythema.  Psychiatric: She has a normal mood and affect. Her behavior is normal.   Remainder of the exam deferred until the day of the ultrasound right renal cyst aspiration.   Imaging: No results found.  Labs:  CBC:  Recent Labs  12/23/16 0909  WBC 5.4  HGB 14.0  HCT 40.0  PLT 201.0    COAGS: No results for input(s): INR, APTT in the last 8760 hours.  BMP:  Recent Labs  05/02/16 12/23/16 0909  NA 144 142  K 4.8 4.5  CL  --  108  CO2  --  27  GLUCOSE  --  107*  BUN 9 10  CALCIUM  --  9.9  CREATININE 0.8 0.78    LIVER FUNCTION TESTS:  Recent Labs  05/02/16 12/23/16 0909  BILITOT  --  0.6  AST 17 17  ALT 11 15  ALKPHOS 66 66  PROT  --  6.6  ALBUMIN  --  4.3     Assessment and Plan:  7 cm right upper pole simple renal cyst. Nonspecific intermittent and vague right upper quadrant abdominal and flank pain. Ultrasound aspiration procedure was reviewed with the patient. The procedure, risks, benefits and alternatives were reviewed. She understands that this may not improve her abdominal or flank pain. She would like to proceed with the procedure. This will be scheduled electively as an outpatient.  Thank you for this interesting consult.  I greatly enjoyed meeting [name] and look forward to participating in their care.  A copy of this report was sent to the requesting provider on this date.  Electronically Signed: Greggory Keen 02/04/2017, 9:14 AM   I spent a total of  15 Minutes   in face to face in clinical consultation, greater than 50% of which was counseling/coordinating care for this patient with right flank pain and a renal cyst.

## 2017-02-06 ENCOUNTER — Ambulatory Visit: Payer: 59 | Admitting: Cardiology

## 2017-02-06 ENCOUNTER — Other Ambulatory Visit: Payer: Self-pay | Admitting: Radiology

## 2017-02-07 ENCOUNTER — Ambulatory Visit (HOSPITAL_COMMUNITY)
Admission: RE | Admit: 2017-02-07 | Discharge: 2017-02-07 | Disposition: A | Discharge status: Home / Self Care | Payer: 59 | Source: Ambulatory Visit | Attending: Interventional Radiology | Admitting: Interventional Radiology

## 2017-02-07 ENCOUNTER — Encounter (HOSPITAL_COMMUNITY): Payer: Self-pay

## 2017-02-07 DIAGNOSIS — Z888 Allergy status to other drugs, medicaments and biological substances status: Secondary | ICD-10-CM | POA: Insufficient documentation

## 2017-02-07 DIAGNOSIS — Z90710 Acquired absence of both cervix and uterus: Secondary | ICD-10-CM | POA: Insufficient documentation

## 2017-02-07 DIAGNOSIS — F329 Major depressive disorder, single episode, unspecified: Secondary | ICD-10-CM | POA: Diagnosis not present

## 2017-02-07 DIAGNOSIS — Z9889 Other specified postprocedural states: Secondary | ICD-10-CM | POA: Diagnosis not present

## 2017-02-07 DIAGNOSIS — F419 Anxiety disorder, unspecified: Secondary | ICD-10-CM | POA: Diagnosis not present

## 2017-02-07 DIAGNOSIS — N281 Cyst of kidney, acquired: Secondary | ICD-10-CM | POA: Insufficient documentation

## 2017-02-07 DIAGNOSIS — E785 Hyperlipidemia, unspecified: Secondary | ICD-10-CM | POA: Insufficient documentation

## 2017-02-07 DIAGNOSIS — Z79899 Other long term (current) drug therapy: Secondary | ICD-10-CM | POA: Diagnosis not present

## 2017-02-07 LAB — PROTIME-INR
INR: 0.9
PROTHROMBIN TIME: 12.2 s (ref 11.4–15.2)

## 2017-02-07 LAB — CBC WITH DIFFERENTIAL/PLATELET
BASOS ABS: 0.1 10*3/uL (ref 0.0–0.1)
BASOS PCT: 1 %
EOS ABS: 0.3 10*3/uL (ref 0.0–0.7)
Eosinophils Relative: 4 %
HCT: 43.3 % (ref 36.0–46.0)
HEMOGLOBIN: 15.4 g/dL — AB (ref 12.0–15.0)
Lymphocytes Relative: 35 %
Lymphs Abs: 2.3 10*3/uL (ref 0.7–4.0)
MCH: 35.3 pg — ABNORMAL HIGH (ref 26.0–34.0)
MCHC: 35.6 g/dL (ref 30.0–36.0)
MCV: 99.3 fL (ref 78.0–100.0)
Monocytes Absolute: 0.4 10*3/uL (ref 0.1–1.0)
Monocytes Relative: 7 %
NEUTROS ABS: 3.5 10*3/uL (ref 1.7–7.7)
NEUTROS PCT: 53 %
Platelets: 215 10*3/uL (ref 150–400)
RBC: 4.36 MIL/uL (ref 3.87–5.11)
RDW: 12.2 % (ref 11.5–15.5)
WBC: 6.6 10*3/uL (ref 4.0–10.5)

## 2017-02-07 LAB — BASIC METABOLIC PANEL
ANION GAP: 6 (ref 5–15)
BUN: 9 mg/dL (ref 6–20)
CALCIUM: 10.4 mg/dL — AB (ref 8.9–10.3)
CO2: 27 mmol/L (ref 22–32)
CREATININE: 0.85 mg/dL (ref 0.44–1.00)
Chloride: 109 mmol/L (ref 101–111)
GFR calc non Af Amer: >60 mL/min (ref >60)
Glucose, Bld: 98 mg/dL (ref 65–99)
Potassium: 4 mmol/L (ref 3.5–5.1)
SODIUM: 142 mmol/L (ref 135–145)

## 2017-02-07 MED ORDER — LIDOCAINE HCL 1 % IJ SOLN
INTRAMUSCULAR | Status: AC
  Filled 2017-02-07: qty 20

## 2017-02-07 MED ORDER — MIDAZOLAM HCL 2 MG/2ML IJ SOLN
INTRAMUSCULAR | Status: AC | PRN
  Administered 2017-02-07: 1 mg via INTRAVENOUS

## 2017-02-07 MED ORDER — SODIUM CHLORIDE 0.9 % IV SOLN
INTRAVENOUS | Status: DC
  Administered 2017-02-07: 07:00:00 via INTRAVENOUS

## 2017-02-07 MED ORDER — FENTANYL CITRATE (PF) 100 MCG/2ML IJ SOLN
INTRAMUSCULAR | Status: AC | PRN
  Administered 2017-02-07: 50 ug via INTRAVENOUS

## 2017-02-07 MED ORDER — FENTANYL CITRATE (PF) 100 MCG/2ML IJ SOLN
INTRAMUSCULAR | Status: AC
  Filled 2017-02-07: qty 2

## 2017-02-07 MED ORDER — MIDAZOLAM HCL 2 MG/2ML IJ SOLN
INTRAMUSCULAR | Status: AC
  Filled 2017-02-07: qty 2

## 2017-02-07 NOTE — Procedures (Signed)
RUQ pain, 7cm RUP renal cyst  S/p US aspiration RUP renal cyst  No comp Stable EBL 0 Cytology pending Full report in pacs

## 2017-02-07 NOTE — Sedation Documentation (Signed)
Patient denies pain and is resting comfortably.  

## 2017-02-07 NOTE — Sedation Documentation (Signed)
Patient is resting comfortably. 

## 2017-02-07 NOTE — Discharge Instructions (Signed)

## 2017-02-07 NOTE — H&P (Signed)
Referring Physician(s): [place]  Supervising Physician: Daryll Brod  Patient Status:  [facility]  Chief Complaint:  right renal cyst/right flank pain  Subjective:  Pt familiar to IR service from recent consultation with Dr. Annamaria Boots on 02/04/17 to discuss treatment options for symptomatic right upper pole renal cyst. She was deemed an appropriate candidate for image guided aspiration of the cyst and presents today for the procedure. She currently denies fever, headache, chest pain, dyspnea, cough, nausea, vomiting, dysuria, hematuria. Additional history as below. Past Medical History:  Diagnosis Date  . Anxiety   . 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 Depression   . 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 Hyperlipidemia    runs around 245 - diet controlled, no meds   Past Surgical History:  Procedure Laterality Date  . ABDOMINAL HYSTERECTOMY  08/2008   fibroid bleeding  . BREAST BIOPSY Right    Aug 2015-"tracker' placed for hyperplasia cells  . CERVICAL CONE BIOPSY    . WISDOM TOOTH EXTRACTION        Allergies: Metronidazole  Medications: Prior to Admission medications   Medication Sig Start Date End Date Taking? Authorizing Provider  atorvastatin (LIPITOR) 20 MG tablet Take 1 tablet (20 mg total) by mouth daily. 01/03/17   Panosh, Standley Brooking, MD  Biotin 10 MG TABS Take by mouth. Reported on 09/20/2015    [provider]  Cholecalciferol (VITAMIN D3 PO) Take 5,000 Units by mouth daily.    [provider]  citalopram (CELEXA) 10 MG tablet TAKE 1 TABLET BY MOUTH  DAILY 01/06/17   Panosh, Standley Brooking, MD  COD LIVER OIL PO Take by mouth.      [provider]  glucosamine-chondroitin 500-400 MG tablet Take 1 tablet by mouth 3 (three) times daily.      [provider]  MULTIPLE VITAMIN PO Take by mouth. Reported on 09/20/2015    [provider]  Probiotic Product (ALIGN) 4 MG CAPS Take by mouth.       [provider]  traZODone (DESYREL) 50 MG tablet Take 0.5-1 tablets (25-50 mg total) by mouth at bedtime as needed for sleep. Patient not taking: Reported on 12/23/2016 04/08/16   Burnis Medin, MD     Vital Signs: Blood pressure 122/86, heart rate 58, temp 98.4, respirations 16, O2 sat 100% room air    Physical Exam awake, alert. Chest clear to auscultation bilaterally. Heart slightly bradycardic regular rhythm. Abdomen soft, positive bowel sounds, mildly tender right flank/ right lateral abdominal region; no lower extremity edema  Imaging: No results found.  Labs:  CBC:  Recent Labs  12/23/16 0909 02/07/17 0650  WBC 5.4 6.6  HGB 14.0 15.4*  HCT 40.0 43.3  PLT 201.0 215    COAGS:  Recent Labs  02/07/17 0650  INR 0.90    BMP:  Recent Labs  05/02/16 12/23/16 0909 02/07/17 0650  NA 144 142 142  K 4.8 4.5 4.0  CL  --  108 109  CO2  --  27 27  GLUCOSE  --  107* 98  BUN 9 10 9   CALCIUM  --  9.9 10.4*  CREATININE 0.8 0.78 0.85  GFRNONAA  --   --  >60  GFRAA  --   --  >60    LIVER FUNCTION TESTS:  Recent Labs  05/02/16 12/23/16 0909  BILITOT  --  0.6  AST 17 17  ALT 11  15  ALKPHOS 66 66  PROT  --  6.6  ALBUMIN  --  4.3    Assessment and Plan: Patient with history of symptomatic 7.1 cm right upper pole simple renal cyst. Seen recently in consultation by Dr. Annamaria Boots and deemed an appropriate candidate for image guided aspiration of the cyst. She presents today for the procedure.Risks and benefits discussed with the patient/spouse including, but not limited to bleeding, infection, damage to adjacent structures or low yield requiring additional tests. All of the patient's questions were answered, patient is agreeable to proceed. Consent signed and in chart.     Electronically Signed: D. Rowe Robert, PA-C 02/07/2017, 8:22 AM   I spent a total of 20 minutes at the the patient's bedside AND on the patient's hospital floor or unit, greater  than 50% of which was counseling/coordinating care for image guided aspiration of right renal cyst

## 2017-02-13 ENCOUNTER — Ambulatory Visit (INDEPENDENT_AMBULATORY_CARE_PROVIDER_SITE_OTHER): Payer: 59 | Admitting: Cardiovascular Disease

## 2017-02-13 ENCOUNTER — Encounter: Payer: Self-pay | Admitting: Cardiovascular Disease

## 2017-02-13 ENCOUNTER — Telehealth: Payer: Self-pay | Admitting: Radiology

## 2017-02-13 VITALS — BP 124/77 | HR 55 | Ht 65.0 in | Wt 135.0 lb

## 2017-02-13 DIAGNOSIS — F17200 Nicotine dependence, unspecified, uncomplicated: Secondary | ICD-10-CM | POA: Diagnosis not present

## 2017-02-13 DIAGNOSIS — Z9189 Other specified personal risk factors, not elsewhere classified: Secondary | ICD-10-CM

## 2017-02-13 DIAGNOSIS — E7800 Pure hypercholesterolemia, unspecified: Secondary | ICD-10-CM | POA: Diagnosis not present

## 2017-02-13 NOTE — Patient Instructions (Signed)
Your physician has requested that you have an exercise tolerance test. For further information please visit [email or web address]. Please also follow instruction sheet, as given.  Dr Sallyanne Kuster recommends that you follow-up as needed.

## 2017-02-13 NOTE — Progress Notes (Signed)
Called the patient with results today at 9:57 am :-)

## 2017-02-13 NOTE — Progress Notes (Signed)
Cardiology Consultation Note:    Date:  02/15/2017   ID:  Jessica Cooper, DOB 02-02-1961, MRN [id number]  PCP:  Burnis Medin, MD  Cardiologist:  Sanda Klein, MD    Referring MD: Burnis Medin, MD   Chief Complaint  Patient presents with  . New Patient (Initial Visit)    pt c/o hyperlipidiemia  Jessica Cooper is a 56 y.o. female who is being seen today for the evaluation of upper cholesterolemia and coronary risk factors at the request of Panosh, Standley Brooking, MD.   History of Present Illness:    Jessica Cooper is a 56 y.o. female with a hx of hypercholesterolemia and a family history of heart failure and coronary artery disease, ongoing cigarette abuse and peri/post-menopausal state, presents with concerns about development of coronary artery disease.  Both her parents had heart disease. Her father, Mr. Toy Cookey is also my patient. Her maternal grandmother also had heart failure. She reports that her cholesterol has been 240 or so her whole life. She also tells me that her HDL is always been good, but this doesn't give her any reassurance since her mother had a similar pattern. Labs performed 12/23/2016 showed that while her HDL is not bad at 67, her LDL is moderately elevated at 151. Triglycerides are normal. Total cholesterol is 239.  Mrs. Banas has never had angina or exertional dyspnea or other symptoms to suggest coronary artery disease. She walks about a mile a day at a 30 minute mile pace. She denies problems with edema, claudication, focal neurological complaints, palpitations or syncope. She has smoked a pack of cigarettes a day for the last 40 years and has tried multiple times to quit smoking. She has tried using Chantix, Wellbutrin and nicotine patches unsuccessfully. She finds that smoking helps with problems with depression and anxiety and when she quit smoking she feelt emotionally drained and depressed, sometimes near suicidal.  Past Medical History:  Diagnosis Date  . Anxiety    . 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 Depression   . 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 Hyperlipidemia    runs around 245 - diet controlled, no meds    Past Surgical History:  Procedure Laterality Date  . ABDOMINAL HYSTERECTOMY  08/2008   fibroid bleeding  . BREAST BIOPSY Right    Aug 2015-"tracker' placed for hyperplasia cells  . CERVICAL CONE BIOPSY    . WISDOM TOOTH EXTRACTION      Current Medications: Current Meds  Medication Sig  . atorvastatin (LIPITOR) 20 MG tablet Take 1 tablet (20 mg total) by mouth daily.  . Biotin 10 MG TABS Take by mouth. Reported on 09/20/2015  . Cholecalciferol (VITAMIN D3 PO) Take 1,000 Units by mouth daily.   . citalopram (CELEXA) 10 MG tablet TAKE 1 TABLET BY MOUTH  DAILY  . COD LIVER OIL PO Take by mouth.    Marland Kitchen glucosamine-chondroitin 500-400 MG tablet Take 1 tablet by mouth 3 (three) times daily.    . MULTIPLE VITAMIN PO Take by mouth. Reported on 09/20/2015  . Probiotic Product (ALIGN) 4 MG CAPS Take by mouth.       Allergies:   Metronidazole   Social History   Social History  . Marital status: Married    Spouse name: N/A  . Number of children: N/A  . Years of education: N/A   Social History Main Topics  . Smoking status: Current Every Day Smoker  Packs/day: 1.00    Years: 30.00    Types: Cigarettes  . Smokeless tobacco: Never Used  . Alcohol use 4.2 oz/week    7 Glasses of wine per week  . Drug use: No  . Sexual activity: Yes    Birth control/ protection: Surgical     Comment: Abd Hysterectomy   Other Topics Concern  . None   Social History Narrative   Occupation: Research scientist (medical)   Married   Regular exercise- no   Smoker    HH of 2   5 pets  2 dogs and cats.  One outside cat.    Moved from [place] from job   G0P0     Family History: The patient's family history includes Breast cancer in her sister; Crohn's disease in her sister; Heart failure in  her father and maternal grandmother; Hypertension in her father. There is no history of Colon cancer, Esophageal cancer, Rectal cancer, or Stomach cancer. ROS:   Please see the history of present illness.    All other systems reviewed and are negative.  EKGs/Labs/Other Studies Reviewed:    The following studies were reviewed today: Notes from PCP  EKG:  EKG is  ordered today.  The ekg ordered today demonstrates venous rhythm, left atrial abnormality, generalized low voltage, no repolarization abnormalities, normal QT  Recent Labs: 12/23/2016: ALT 15 02/07/2017: BUN 9; Creatinine, Ser 0.85; Hemoglobin 15.4; Platelets 215; Potassium 4.0; Sodium 142  Recent Lipid Panel    Component Value Date/Time   CHOL 239 (H) 12/23/2016 0909   TRIG 102.0 12/23/2016 0909   HDL 66.90 12/23/2016 0909   CHOLHDL 4 12/23/2016 0909   VLDL 20.4 12/23/2016 0909   LDLCALC 151 (H) 12/23/2016 0909   LDLDIRECT 178.9 09/18/2012 0854    Physical Exam:    VS:  BP 124/77 (BP Location: Left Arm, Patient Position: Sitting, Cuff Size: Normal)   Pulse (!) 55   Ht 5\' 5"  (1.651 m)   Wt 135 lb (61.2 kg)   BMI 22.47 kg/m     Wt Readings from Last 3 Encounters:  02/13/17 135 lb (61.2 kg)  02/04/17 137 lb (62.1 kg)  12/23/16 138 lb 9.6 oz (62.9 kg)     GEN:  Well nourished, well developed in no acute distress HEENT: Normal NECK: No JVD; No carotid bruits LYMPHATICS: No lymphadenopathy CARDIAC: RRR, no murmurs, rubs, gallops RESPIRATORY:  Clear to auscultation without rales, wheezing or rhonchi  ABDOMEN: Soft, non-tender, non-distended MUSCULOSKELETAL:  No edema; No deformity  SKIN: Warm and dry NEUROLOGIC:  Alert and oriented x 3 PSYCHIATRIC:  Normal affect   ASSESSMENT:    1. At risk for coronary artery disease   2. Cardiovascular risk factor    PLAN:    In order of problems listed above:  1. HLP: I believe she meets criteria for treatment with a statin, based on ongoing tobacco abuse and a family  history of heart disease. Atorvastatin 20 mg daily has recently been started. Target LDL, based on data that we have so far, would be less than 100. 2. Smoking: I believe she is on is when she states that she has really try to quit smoking but finds it difficult to due to her emotional issues. I told her not to give up. We discussed that it takes the average person multiple attempts at smoking cessation until they found he succeed. It is a very important long-term goal to avoid multiple possible health problems. 3. Coronary risk factors: A  few of her age, postmenopausal state, hyperlipidemia and smoking, it is very reasonable to have her undergo a treadmill stress test. (We also discussed the fact that a coronary calcium score might offer some prognostic information, but I don't think it would change the recommendation that she should take lipid-lowering medications. Therefore I'm not sure it will make a difference. It might be an additional motivating factor to quit smoking. I definitely would not do it if we find that her stress test is abnormal so that she needs more detailed coronary workup such as cardiac catheterization).   Medication Adjustments/Labs and Tests Ordered: Current medicines are reviewed at length with the patient today.  Concerns regarding medicines are outlined above.  Orders Placed This Encounter  Procedures  . EXERCISE TOLERANCE TEST  . EKG 12-Lead   No orders of the defined types were placed in this encounter.   Signed, Sanda Klein, MD  02/15/2017 6:40 PM    [place]

## 2017-02-13 NOTE — Telephone Encounter (Signed)
Phoned patient to review results renal cyst aspiration.  Results:  Negative.  Patient continues to experience some localized tenderness at injection site. Has not been taking an NSAID.  States that she will start today and will call back if needed.  Idamae Coccia [place], RN 02/13/2017 10:00 AM

## 2017-02-20 ENCOUNTER — Telehealth (HOSPITAL_COMMUNITY): Payer: Self-pay

## 2017-02-20 NOTE — Telephone Encounter (Signed)
Encounter complete. 

## 2017-02-21 ENCOUNTER — Telehealth (HOSPITAL_COMMUNITY): Payer: Self-pay

## 2017-02-21 NOTE — Telephone Encounter (Signed)
Encounter complete. 

## 2017-02-25 ENCOUNTER — Ambulatory Visit (HOSPITAL_COMMUNITY)
Admission: RE | Admit: 2017-02-25 | Discharge: 2017-02-25 | Disposition: A | Discharge status: Home / Self Care | Payer: 59 | Source: Ambulatory Visit | Attending: Cardiovascular Disease | Admitting: Cardiovascular Disease

## 2017-02-25 DIAGNOSIS — Z9189 Other specified personal risk factors, not elsewhere classified: Secondary | ICD-10-CM | POA: Diagnosis not present

## 2017-02-25 LAB — EXERCISE TOLERANCE TEST
CSEPED: 10 min
CSEPHR: 89 %
CSEPPHR: 148 {beats}/min
Estimated workload: 11.7 METS
Exercise duration (sec): 1 s
MPHR: 166 {beats}/min
RPE: 18
Rest HR: 51 {beats}/min

## 2017-03-04 DIAGNOSIS — N281 Cyst of kidney, acquired: Secondary | ICD-10-CM | POA: Diagnosis not present

## 2017-03-14 ENCOUNTER — Other Ambulatory Visit (HOSPITAL_COMMUNITY)
Admission: RE | Admit: 2017-03-14 | Discharge: 2017-03-14 | Disposition: A | Discharge status: Home / Self Care | Payer: 59 | Source: Ambulatory Visit | Attending: Internal Medicine | Admitting: Internal Medicine

## 2017-03-14 ENCOUNTER — Ambulatory Visit (INDEPENDENT_AMBULATORY_CARE_PROVIDER_SITE_OTHER): Payer: 59 | Admitting: Internal Medicine

## 2017-03-14 ENCOUNTER — Encounter: Payer: Self-pay | Admitting: Internal Medicine

## 2017-03-14 VITALS — BP 102/70 | HR 66 | Temp 98.2°F | Wt 136.0 lb

## 2017-03-14 DIAGNOSIS — R1011 Right upper quadrant pain: Secondary | ICD-10-CM

## 2017-03-14 DIAGNOSIS — N898 Other specified noninflammatory disorders of vagina: Secondary | ICD-10-CM | POA: Insufficient documentation

## 2017-03-14 DIAGNOSIS — Z90710 Acquired absence of both cervix and uterus: Secondary | ICD-10-CM | POA: Diagnosis not present

## 2017-03-14 DIAGNOSIS — Z23 Encounter for immunization: Secondary | ICD-10-CM | POA: Diagnosis not present

## 2017-03-14 LAB — POCT URINALYSIS DIPSTICK
BILIRUBIN UA: NEGATIVE
Blood, UA: NEGATIVE
Glucose, UA: NEGATIVE
Ketones, UA: NEGATIVE
LEUKOCYTES UA: NEGATIVE
Nitrite, UA: NEGATIVE
PH UA: 7 (ref 5.0–8.0)
Spec Grav, UA: 1.015 (ref 1.010–1.025)
UROBILINOGEN UA: 0.2 U/dL

## 2017-03-14 NOTE — Progress Notes (Signed)
Chief Complaint  Patient presents with  . Acute Visit    Brown vaginal discharge and RLQ dull pain. Denies burning with urination. States feels raw.     HPI: Jessica Cooper 56 y.o.  sda  Because of concern with brownish vaginal discharge and her right upper quadrant discomfort is continuing despite drainage of a large renal cyst.  Raw on outside.  Period  March 2010.   Has ovaries. But has had a partial hysterectomy. No recent antibiotics Last gyne  mattern    2 year ago check  Cis in 20s  And then bad cramps.   And then had partial hysterectomy  For fibroid.  ROS: See pertinent positives and negatives per HPI.achiness all the time right upper quadrant and side pain.  Past Medical History:  Diagnosis Date  . Anxiety   . 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 Depression   . 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 Hyperlipidemia    runs around 245 - diet controlled, no meds    Family History  Problem Relation Age of Onset  . Heart failure Father        heart valve problem   . Hypertension Father   . Breast cancer Sister   . Crohn's disease Sister   . Heart failure Maternal Grandmother   . Colon cancer Neg Hx   . Esophageal cancer Neg Hx   . Rectal cancer Neg Hx   . Stomach cancer Neg Hx     Social History   Social History  . Marital status: Married    Spouse name: N/A  . Number of children: N/A  . Years of education: N/A   Social History Main Topics  . Smoking status: Current Every Day Smoker    Packs/day: 1.00    Years: 30.00    Types: Cigarettes  . Smokeless tobacco: Never Used  . Alcohol use 4.2 oz/week    7 Glasses of wine per week  . Drug use: No  . Sexual activity: Yes    Birth control/ protection: Surgical     Comment: Abd Hysterectomy   Other Topics Concern  . Not on file   Social History Narrative   Occupation: Chiropodist Degree   Married   Regular exercise- no   Smoker    HH of 2   5  pets  2 dogs and cats.  One outside cat.    Moved from [place] from job   G0P0    Outpatient Medications Prior to Visit  Medication Sig Dispense Refill  . atorvastatin (LIPITOR) 20 MG tablet Take 1 tablet (20 mg total) by mouth daily. 30 tablet 3  . Biotin 10 MG TABS Take by mouth. Reported on 09/20/2015    . Cholecalciferol (VITAMIN D3 PO) Take 1,000 Units by mouth daily.     . citalopram (CELEXA) 10 MG tablet TAKE 1 TABLET BY MOUTH  DAILY 90 tablet 1  . COD LIVER OIL PO Take by mouth.      Marland Kitchen glucosamine-chondroitin 500-400 MG tablet Take 1 tablet by mouth 3 (three) times daily.      . MULTIPLE VITAMIN PO Take by mouth. Reported on 09/20/2015    . Probiotic Product (ALIGN) 4 MG CAPS Take by mouth.       No facility-administered medications prior to visit.      EXAM:  BP 102/70 (BP Location: Right Arm, Patient Position: Sitting, Cuff Size: Normal)  Pulse 66   Temp 98.2 F (36.8 C) (Oral)   Wt 136 lb (61.7 kg)   BMI 22.63 kg/m   Body mass index is 22.63 kg/m.  GENERAL: vitals reviewed and listed above, alert, oriented, appears well hydrated and in no acute distress HEENT: atraumatic, conjunctiva  clear, no obvious abnormalities on inspection of external nose and ears OP : no lesion edema or exudate  NECK: no obvious masses on inspection palpation  LUNGS: clear to auscultation bilaterally, no wheezes, rales or rhonchi, good air movement CV: HRRR, no clubbing cyanosis or  peripheral edema nl cap refill  MS: moves all extremities without noticeable focal  Abnormality Ext gu nl cuff clear  White grey dc no blood no fb seen  Bimanual no masses or area of tenderness  Localized   Vag  For yeast trich and bv sent  PSYCH: pleasant and cooperative, no obvious depression or anxiety  ASSESSMENT AND PLAN:  Discussed the following assessment and plan:  Vaginal discharge - irritation  exam reassuring will conctact aboput results. consider atrophic?  - Plan: POC Urinalysis Dipstick,  Cervicovaginal ancillary only  RUQ discomfort - uncertain cause not a lot better after drainage of a large renal cyst make appointment with Dr. Tonye Royalty in GI for opinion  S/P hysterectomy  Need for influenza vaccination - Plan: Flu Vaccine QUAD 6+ mos PF IM (Fluarix Quad PF)  -Patient advised to return or notify health care team  if symptoms worsen ,persist or new concerns arise.  Patient Instructions  Exam looks good   But checking for yeast etc.  Urine is clear today .   You can get  Menopausal dryness .   If recurring problem then  Consider seeing your GYNe  For  Opinion.   Still not sure what the side pain is .   Gi next  Dr Hilarie Fredrickson et al    Standley Brooking. Cyntia Staley M.D.

## 2017-03-14 NOTE — Patient Instructions (Addendum)
Exam looks good   But checking for yeast etc.  Urine is clear today .   You can get  Menopausal dryness .   If recurring problem then  Consider seeing your GYNe  For  Opinion.   Still not sure what the side pain is .   Gi next  Dr Luz Brazen al

## 2017-03-17 LAB — CERVICOVAGINAL ANCILLARY ONLY
Bacterial vaginitis: NEGATIVE
Candida vaginitis: NEGATIVE
Trichomonas: NEGATIVE

## 2017-03-19 ENCOUNTER — Encounter: Payer: Self-pay | Admitting: *Deleted

## 2017-03-20 ENCOUNTER — Other Ambulatory Visit: Payer: Self-pay

## 2017-03-20 ENCOUNTER — Other Ambulatory Visit (INDEPENDENT_AMBULATORY_CARE_PROVIDER_SITE_OTHER): Payer: 59

## 2017-03-20 ENCOUNTER — Ambulatory Visit (INDEPENDENT_AMBULATORY_CARE_PROVIDER_SITE_OTHER): Payer: 59 | Admitting: Internal Medicine

## 2017-03-20 ENCOUNTER — Encounter: Payer: Self-pay | Admitting: Internal Medicine

## 2017-03-20 VITALS — BP 106/58 | HR 68 | Ht 65.0 in | Wt 136.0 lb

## 2017-03-20 DIAGNOSIS — Z86010 Personal history of colonic polyps: Secondary | ICD-10-CM

## 2017-03-20 DIAGNOSIS — R1011 Right upper quadrant pain: Secondary | ICD-10-CM

## 2017-03-20 DIAGNOSIS — D7589 Other specified diseases of blood and blood-forming organs: Secondary | ICD-10-CM | POA: Diagnosis not present

## 2017-03-20 DIAGNOSIS — R109 Unspecified abdominal pain: Secondary | ICD-10-CM

## 2017-03-20 LAB — FOLATE: Folate: >23.9 ng/mL (ref >5.9)

## 2017-03-20 LAB — CBC WITH DIFFERENTIAL/PLATELET
BASOS PCT: 0.7 % (ref 0.0–3.0)
Basophils Absolute: 0 10*3/uL (ref 0.0–0.1)
EOS ABS: 0.2 10*3/uL (ref 0.0–0.7)
Eosinophils Relative: 2.5 % (ref 0.0–5.0)
HCT: 43.4 % (ref 36.0–46.0)
HEMOGLOBIN: 14.8 g/dL (ref 12.0–15.0)
LYMPHS ABS: 2.5 10*3/uL (ref 0.7–4.0)
Lymphocytes Relative: 35.1 % (ref 12.0–46.0)
MCHC: 34.1 g/dL (ref 30.0–36.0)
MCV: 104.1 fl — ABNORMAL HIGH (ref 78.0–100.0)
MONO ABS: 0.4 10*3/uL (ref 0.1–1.0)
Monocytes Relative: 5.8 % (ref 3.0–12.0)
NEUTROS PCT: 55.9 % (ref 43.0–77.0)
Neutro Abs: 4 10*3/uL (ref 1.4–7.7)
Platelets: 217 10*3/uL (ref 150.0–400.0)
RBC: 4.16 Mil/uL (ref 3.87–5.11)
RDW: 12.4 % (ref 11.5–15.5)
WBC: 7.2 10*3/uL (ref 4.0–10.5)

## 2017-03-20 LAB — COMPREHENSIVE METABOLIC PANEL
ALBUMIN: 4.5 g/dL (ref 3.5–5.2)
ALK PHOS: 71 U/L (ref 39–117)
ALT: 27 U/L (ref 0–35)
AST: 22 U/L (ref 0–37)
BUN: 9 mg/dL (ref 6–23)
CO2: 28 mEq/L (ref 19–32)
CREATININE: 0.77 mg/dL (ref 0.40–1.20)
Calcium: 10.8 mg/dL — ABNORMAL HIGH (ref 8.4–10.5)
Chloride: 107 mEq/L (ref 96–112)
GFR: 82.95 mL/min (ref >60.00)
GLUCOSE: 104 mg/dL — AB (ref 70–99)
Potassium: 4.7 mEq/L (ref 3.5–5.1)
SODIUM: 140 meq/L (ref 135–145)
TOTAL PROTEIN: 7 g/dL (ref 6.0–8.3)
Total Bilirubin: 0.5 mg/dL (ref 0.2–1.2)

## 2017-03-20 LAB — HIGH SENSITIVITY CRP: CRP HIGH SENSITIVITY: 0.32 mg/L (ref 0.000–5.000)

## 2017-03-20 LAB — VITAMIN B12: Vitamin B-12: 561 pg/mL (ref 211–911)

## 2017-03-20 LAB — IGA: IGA: 84 mg/dL (ref 68–378)

## 2017-03-20 NOTE — Patient Instructions (Addendum)
Your physician has requested that you go to the basement for the following lab work before leaving today: CMP, CBC, IgA, TTG, CRP  You have been scheduled for a CT scan of the abdomen and pelvis at [facility] ([address]---this is in the same building as [facility]).   You are scheduled on Monday, 03/24/17 at 3:30 pm. You should arrive 15 minutes prior to your appointment time for registration. Please follow the written instructions below on the day of your exam:  WARNING: IF YOU ARE ALLERGIC TO IODINE/X-RAY DYE, PLEASE NOTIFY RADIOLOGY IMMEDIATELY AT [phone]! YOU WILL BE GIVEN A 13 HOUR PREMEDICATION PREP.  1) Do not eat or drink anything after 11:30 am (4 hours prior to your test) 2) You have been given 2 bottles of oral contrast to drink. The solution may taste  better if refrigerated, but do NOT add ice or any other liquid to this solution. Shake well before drinking.    Drink 1 bottle of contrast @ 1:30 pm (2 hours prior to your exam)  Drink 1 bottle of contrast @ 2:30 pm (1 hour prior to your exam)  You may take any medications as prescribed with a small amount of water except for the following: Metformin, Glucophage, Glucovance, Avandamet, Riomet, Fortamet, Actoplus Met, Janumet, Glumetza or Metaglip. The above medications must be held the day of the exam AND 48 hours after the exam.  The purpose of you drinking the oral contrast is to aid in the visualization of your intestinal tract. The contrast solution may cause some diarrhea. Before your exam is started, you will be given a small amount of fluid to drink. Depending on your individual set of symptoms, you may also receive an intravenous injection of x-ray contrast/dye. Plan on being at [facility] for 30 minutes or longer, depending on the type of exam you are having performed.  This test typically takes 30-45 minutes to complete.  If you have any questions regarding your exam or if you need to  reschedule, you may call the CT department at [phone] between the hours of 8:00 am and 5:00 pm, Monday-Friday.  ________________________________________________________________________  If you are age 54 or older, your body mass index should be between 23-30. Your Body mass index is 22.63 kg/m. If this is out of the aforementioned range listed, please consider follow up with your Primary Care Provider.  If you are age 20 or younger, your body mass index should be between 19-25. Your Body mass index is 22.63 kg/m. If this is out of the aformentioned range listed, please consider follow up with your Primary Care Provider.

## 2017-03-20 NOTE — Progress Notes (Signed)
Patient ID: Jessica Cooper, female   DOB: 03/14/1961, 56 y.o.   MRN: [id number] HPI: Jessica Cooper is a 56 year old female with a past medical history of adenomatous colon polyps, colonic diverticulosis, hyperlipidemia, anxiety and depression who is seen in consultation at the request of Dr. Regis Bill to evaluate right upper quadrant abdominal pain. She is here alone today. The patient is known to me from her screening colonoscopy which was performed on 11/13/2015. This revealed 6 polyps ranging from 2-5 mm in size which were removed throughout the colon. These were found to be tubular adenomas without high-grade dysplasia. There was a few diverticuli in the sigmoid colon and small internal hemorrhoids found on retroflexion.  She reports that over the last 9 months, since January 2018, she has developed a rather constant dull right upper quadrant abdominal pain. She is constantly aware of this sensation. It did not start abruptly or with any inciting event such as trauma. There are times when it seems to hurt worse than others but there has not been identifiable trigger. She does feel that lying on her right side worsens the pain. No alleviating factors. At times this feels like a spasm or a cramp, contraction type hurting. Not relating to eating or bowel movement. No change in her bowel habit. Her bowel movements are regular particularly when she is using probiotic. Without probiotic she content towards constipation. She's having a bowel movement almost daily without blood or melena. She's had a prior partial hysterectomy. This pain does not radiate.  Her primary care provider evaluated this with ultrasound which was unremarkable liver and gallbladder. She did have a 7 cm cyst in the right kidney which was aspirated and found to be benign. Once this was aspirated it had no effect on her right upper quadrant pain.  She has a sister with Crohn's disease and breast cancer. Heart disease also runs in her family. She  does use tobacco on a daily basis. She is married thousand children. She works as a Nurse, learning disability. She drinks approximately 2 alcoholic beverages per day. No illicit drug use.  Past Medical History:  Diagnosis Date  . Anxiety   . 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 Depression   . Diverticulosis   . 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 Hyperlipidemia    runs around 245 - diet controlled, no meds  . Internal hemorrhoids   . Tubular adenoma of colon     Past Surgical History:  Procedure Laterality Date  . ABDOMINAL HYSTERECTOMY  08/2008   fibroid bleeding  . BREAST BIOPSY Right    Aug 2015-"tracker' placed for hyperplasia cells  . CERVICAL CONE BIOPSY    . WISDOM TOOTH EXTRACTION      Outpatient Medications Prior to Visit  Medication Sig Dispense Refill  . atorvastatin (LIPITOR) 20 MG tablet Take 1 tablet (20 mg total) by mouth daily. 30 tablet 3  . Biotin 10 MG TABS Take by mouth. Reported on 09/20/2015    . Cholecalciferol (VITAMIN D3 PO) Take 1,000 Units by mouth daily.     . citalopram (CELEXA) 10 MG tablet TAKE 1 TABLET BY MOUTH  DAILY 90 tablet 1  . COD LIVER OIL PO Take by mouth.      Marland Kitchen glucosamine-chondroitin 500-400 MG tablet Take 1 tablet by mouth 3 (three) times daily.      . MULTIPLE VITAMIN PO Take by mouth. Reported on 09/20/2015    .  Probiotic Product (ALIGN) 4 MG CAPS Take by mouth.       No facility-administered medications prior to visit.     Allergies  Allergen Reactions  . Metronidazole Rash    Family History  Problem Relation Age of Onset  . Heart failure Father        heart valve problem   . Hypertension Father   . Breast cancer Sister   . Crohn's disease Sister   . Heart failure Maternal Grandmother   . Colon cancer Neg Hx   . Esophageal cancer Neg Hx   . Rectal cancer Neg Hx   . Stomach cancer Neg Hx     Social History  Substance Use Topics  . Smoking status: Current Every Day Smoker     Packs/day: 1.00    Years: 30.00    Types: Cigarettes  . Smokeless tobacco: Never Used  . Alcohol use 4.2 oz/week    7 Glasses of wine per week    ROS: As per history of present illness, otherwise negative  BP (!) 106/58   Pulse 68   Ht 5\' 5"  (1.651 m)   Wt 136 lb (61.7 kg)   BMI 22.63 kg/m  Constitutional: Well-developed and well-nourished. No distress. HEENT: Normocephalic and atraumatic. Oropharynx is clear and moist. Conjunctivae are normal.  No scleral icterus. Neck: Neck supple. Trachea midline. Cardiovascular: Normal rate, regular rhythm and intact distal pulses. No M/R/G Pulmonary/chest: Effort normal and breath sounds normal. No wheezing, rales or rhonchi. Abdominal: Soft, mild discomfort with deep palpation in the right upper quadrant, Carnett's sign is negative, nondistended. Bowel sounds active throughout. There are no masses palpable. No hepatosplenomegaly. Extremities: no clubbing, cyanosis, or edema Neurological: Alert and oriented to person place and time. Skin: Skin is warm and dry.  Psychiatric: Normal mood and affect. Behavior is normal.  RELEVANT LABS AND IMAGING: CBC    Component Value Date/Time   WBC 6.6 02/07/2017 0650   RBC 4.36 02/07/2017 0650   HGB 15.4 (H) 02/07/2017 0650   HCT 43.3 02/07/2017 0650   PLT 215 02/07/2017 0650   MCV 99.3 02/07/2017 0650   MCH 35.3 (H) 02/07/2017 0650   MCHC 35.6 02/07/2017 0650   RDW 12.2 02/07/2017 0650   LYMPHSABS 2.3 02/07/2017 0650   MONOABS 0.4 02/07/2017 0650   EOSABS 0.3 02/07/2017 0650   BASOSABS 0.1 02/07/2017 0650    CMP     Component Value Date/Time   NA 142 02/07/2017 0650   NA 144 05/02/2016   K 4.0 02/07/2017 0650   CL 109 02/07/2017 0650   CO2 27 02/07/2017 0650   GLUCOSE 98 02/07/2017 0650   BUN 9 02/07/2017 0650   BUN 9 05/02/2016   CREATININE 0.85 02/07/2017 0650   CALCIUM 10.4 (H) 02/07/2017 0650   PROT 6.6 12/23/2016 0909   ALBUMIN 4.3 12/23/2016 0909   AST 17 12/23/2016 0909    ALT 15 12/23/2016 0909   ALKPHOS 66 12/23/2016 0909   BILITOT 0.6 12/23/2016 0909   GFRNONAA >60 02/07/2017 0650   GFRAA >60 02/07/2017 0650   ABDOMEN ULTRASOUND COMPLETE   COMPARISON:  None available   FINDINGS: Gallbladder: No gallstones or wall thickening visualized. No sonographic Murphy sign noted by sonographer.   Common bile duct: Diameter: 3.3 mm   Liver: No focal lesion identified. Within normal limits in parenchymal echogenicity.   IVC: No abnormality visualized.   Pancreas: Visualized portion unremarkable.   Spleen: Size and appearance within normal limits.   Right Kidney: Length:  11.3 cm. Upper pole anechoic cyst measures 5.7 x 5.6 x 7.1 cm. Otherwise normal renal echogenicity. No hydronephrosis.   Left Kidney: Length: 11.4 cm. Echogenicity within normal limits. No mass or hydronephrosis visualized.   Abdominal aorta: No aneurysm visualized.   Other findings: No ascites or free fluid   IMPRESSION: 7.1 cm right upper pole renal cyst.   Negative for gallstones or biliary dilatation   No other acute finding by ultrasound     Electronically Signed   By: Jerilynn Mages.  Shick M.D.   On: 12/31/2016 13:25    ASSESSMENT/PLAN: 56 year old female with a past medical history of adenomatous colon polyps, colonic diverticulosis, hyperlipidemia, anxiety and depression who is seen in consultation at the request of Dr. Regis Bill to evaluate right upper quadrant abdominal pain.  1. Right upper quadrant pain -- nontoxic and pain is now chronic (9+ months).  Gallbladder looks normal by ultrasound and I would not expect gallbladder to cause a constant low level pain. The large cyst on the right kidney could cause pain but it was aspirated and the pain did not change. This was found to be benign. I recommended a CT scan of the abdomen and pelvis with contrast to further evaluate this pain. I'm also going to check CBC recheck liver enzymes, check a CRP and celiac panel. This does not sound  acid peptic and therefore EGD is felt to be low yield. We can reconsider this depending on results of the testing ordered today.  2. History of adenomatous colon polyps -- her surveillance interval is recommended to be 3 years which for her would be in May 2020.   MV:HQIONG, [address], [address], [place] [id number]

## 2017-03-21 LAB — PARATHYROID HORMONE, INTACT (NO CA): PTH: 82 pg/mL — AB (ref 14–64)

## 2017-03-21 LAB — TISSUE TRANSGLUTAMINASE, IGA: (TTG) AB, IGA: 1 U/mL

## 2017-03-24 ENCOUNTER — Ambulatory Visit (INDEPENDENT_AMBULATORY_CARE_PROVIDER_SITE_OTHER)
Admission: RE | Admit: 2017-03-24 | Discharge: 2017-03-24 | Disposition: A | Discharge status: Home / Self Care | Payer: 59 | Source: Ambulatory Visit | Attending: Internal Medicine | Admitting: Internal Medicine

## 2017-03-24 ENCOUNTER — Other Ambulatory Visit: Payer: Self-pay

## 2017-03-24 DIAGNOSIS — R109 Unspecified abdominal pain: Secondary | ICD-10-CM | POA: Diagnosis not present

## 2017-03-24 DIAGNOSIS — R197 Diarrhea, unspecified: Secondary | ICD-10-CM | POA: Diagnosis not present

## 2017-03-24 DIAGNOSIS — E213 Hyperparathyroidism, unspecified: Secondary | ICD-10-CM

## 2017-03-24 DIAGNOSIS — Z86010 Personal history of colonic polyps: Secondary | ICD-10-CM | POA: Diagnosis not present

## 2017-03-24 MED ORDER — IOPAMIDOL (ISOVUE-300) INJECTION 61%
100.0000 mL | Freq: Once | INTRAVENOUS | Status: AC | PRN
  Administered 2017-03-24: 100 mL via INTRAVENOUS

## 2017-03-25 ENCOUNTER — Telehealth: Payer: Self-pay | Admitting: Internal Medicine

## 2017-03-25 NOTE — Telephone Encounter (Signed)
[facility] called to ensure that the patient was called this week to be scheduled as a new patient if possible.

## 2017-03-26 ENCOUNTER — Telehealth: Payer: Self-pay

## 2017-03-26 ENCOUNTER — Telehealth: Payer: Self-pay | Admitting: Internal Medicine

## 2017-03-26 NOTE — Telephone Encounter (Signed)
Please call Jessica Cooper at [facility] [phone]. She states that patient is requesting earlier appointment if possible. Pt in a lot of pain.

## 2017-03-26 NOTE — Telephone Encounter (Signed)
Call placed to Dr. Thayer Ohm office to see if pt can be seen sooner. They are sending it to Dr. Letta Median for review and will call the pt is she can be worked in sooner.

## 2017-03-26 NOTE — Telephone Encounter (Signed)
Attempted to contact patient, not the correct number. And no voicemail on the home number to leave a message.   These are the messages regarding placing her on the waiting list.   Thank you!

## 2017-03-26 NOTE — Telephone Encounter (Signed)
Attempted to contact patient to advise her to be placed on the cancellation schedule, but she did not answer.  Can you try to place her on this schedule??

## 2017-03-26 NOTE — Telephone Encounter (Signed)
Called Vaughan Basta back, and she stated the patient was worried that she has an appointment in December, and wanted a sooner appointment that is is in pain. I advised that the December appointment would be the soonest, but I would check with you to see if you had any openings you could place her in.  Please advise.  Thank you!

## 2017-03-26 NOTE — Telephone Encounter (Signed)
Pt is added to the wait list.

## 2017-03-26 NOTE — Telephone Encounter (Signed)
We can put her on the cancellation list. I doubt her slightly high calcium is the cause of her pain, though - I would suggest to d/w PCP to see if her pain is related to other issues in the meantime.

## 2017-03-26 NOTE — Telephone Encounter (Signed)
Pt scheduled to see Dr. Letta Median 05/26/17, does pt need to be seen sooner? Please advise.

## 2017-03-26 NOTE — Telephone Encounter (Signed)
05/26/17 should be fine If unacceptable to the patient we could refer elsewhere

## 2017-03-27 ENCOUNTER — Telehealth: Payer: Self-pay

## 2017-03-27 NOTE — Telephone Encounter (Signed)
Called patient and advised of the message from MD, noted that we would place her on the waitlist, but to discuss with PCP in the meantime of the pain. Patient understood and had no other questions at this time.

## 2017-03-29 ENCOUNTER — Encounter: Payer: Self-pay | Admitting: Internal Medicine

## 2017-03-29 DIAGNOSIS — E213 Hyperparathyroidism, unspecified: Secondary | ICD-10-CM

## 2017-04-02 NOTE — Telephone Encounter (Signed)
reviewed the level and although not that high  I agree dx can be clinically significant.  Blood tests should be repeated with intact pth and calcium  With   Vit d and   Possible other tests. ..  Eventually the  dexa .  It looks like dr Hilarie Fredrickson referred you to endocrinology   appt for further evaluation and   Further testing  Blood urine tests   etc  can be done through that visit.  But since appt seems to be not until December 3rd    We can try to  Get more information in the interim .   I will place orders for  Repeat intact pth   Calcium level  and vit d  Etc  Have you get appt for this  And then plan ROV   to discuss depending on results .  You may  Need to have a 24 hour urine tests.  For calcium excretion also .

## 2017-04-07 ENCOUNTER — Encounter: Payer: Self-pay | Admitting: Interventional Radiology

## 2017-04-08 ENCOUNTER — Other Ambulatory Visit (INDEPENDENT_AMBULATORY_CARE_PROVIDER_SITE_OTHER): Payer: 59

## 2017-04-08 DIAGNOSIS — E213 Hyperparathyroidism, unspecified: Secondary | ICD-10-CM

## 2017-04-08 LAB — VITAMIN D 25 HYDROXY (VIT D DEFICIENCY, FRACTURES): VITD: 43.43 ng/mL (ref 30.00–100.00)

## 2017-04-09 ENCOUNTER — Encounter: Payer: Self-pay | Admitting: Internal Medicine

## 2017-04-09 LAB — PTH, INTACT AND CALCIUM
Calcium: 10.2 mg/dL (ref 8.6–10.4)
PTH: 67 pg/mL — AB (ref 14–64)

## 2017-04-15 ENCOUNTER — Ambulatory Visit (INDEPENDENT_AMBULATORY_CARE_PROVIDER_SITE_OTHER): Payer: 59 | Admitting: Internal Medicine

## 2017-04-15 ENCOUNTER — Encounter: Payer: Self-pay | Admitting: Internal Medicine

## 2017-04-15 VITALS — BP 124/72 | HR 67 | Ht 66.0 in | Wt 137.0 lb

## 2017-04-15 DIAGNOSIS — E213 Hyperparathyroidism, unspecified: Secondary | ICD-10-CM

## 2017-04-15 NOTE — Patient Instructions (Addendum)
Please restart vitamin D 1000 units daily and continue the Multivitamin.  Skip Biotin and Cod Liver Oil for at least a week before coming back for labs.  Start a 24 urine collection in 1 week.  Patient information (Up-to-Date): Collection of a 24-hour urine specimen  - You should collect every drop of urine during each 24-hour period. It does not matter how much or little urine is passed each time, as long as every drop is collected. - Begin the urine collection in the morning after you wake up, after you have emptied your bladder for the first time. - Urinate (empty the bladder) for the first time and flush it down the toilet. Note the exact time (eg, 6:15 AM). You will begin the urine collection at this time. - Collect every drop of urine during the day and night in an empty collection bottle. Store the bottle at room temperature or in the refrigerator. - If you need to have a bowel movement, any urine passed with the bowel movement should be collected. Try not to include feces with the urine collection. If feces does get mixed in, do not try to remove the feces from the urine collection bottle. - Finish by collecting the first urine passed the next morning, adding it to the collection bottle. This should be within ten minutes before or after the time of the first morning void on the first day (which was flushed). In this example, you would try to void between 6:05 and 6:25 on the second day. - If you need to urinate one hour before the final collection time, drink a full glass of water so that you can void again at the appropriate time. If you have to urinate 20 minutes before, try to hold the urine until the proper time. - Please note the exact time of the final collection, even if it is not the same time as when collection began on day 1. - The bottle(s) may be kept at room temperature for a day or two, but should be kept cool or refrigerated for longer periods of time.  Please come back for a  follow-up appointment in 3 months.

## 2017-04-15 NOTE — Progress Notes (Signed)
Patient ID: Jessica Cooper, female   DOB: 11/27/1960, 56 y.o.   MRN: [id number]    HPI  Jessica Cooper is a 56 y.o.-year-old female, referred by her PCP, Dr. Regis Bill, for evaluation for hypercalcemia/hyperparathyroidism.  Pt was dx with hypercalcemia in 01/2017. I reviewed pt's pertinent labs: Lab Results  Component Value Date   PTH 67 (H) 04/08/2017   PTH 82 (H) 03/20/2017   CALCIUM 10.2 04/08/2017   CALCIUM 10.8 (H) 03/20/2017   CALCIUM 10.4 (H) 02/07/2017   CALCIUM 9.9 12/23/2016   CALCIUM 10.2 12/14/2015   CALCIUM 9.9 12/06/2014   CALCIUM 10.3 10/05/2013   CALCIUM 9.9 09/18/2012   CALCIUM 9.4 11/08/2009   Of note, patient is on high dose biotin, 5000 IU 2x a week. Last dose yesterday.  No available DEXA scans for review.  No fractures or falls.   She has pain in R arm - started this summer.  Of note, she had right kidney nonobstructive calculi per CT abdomen 03/24/2017.   No h/o CKD. Last BUN/Cr: Lab Results  Component Value Date   BUN 9 03/20/2017   BUN 9 02/07/2017   CREATININE 0.77 03/20/2017   CREATININE 0.85 02/07/2017   Pt is not on HCTZ.  No h/o vitamin D deficiency. Reviewed vit D levels: Lab Results  Component Value Date   VD25OH 43.43 04/08/2017   Pt is on vitamin D 1000 units daily + MVI >> stopped 2 week ago.  Pt does not have a FH of hypercalcemia, thyroid cancer; + mom: osteoporosis.   Pt. also has a history of cervical CIS.   ROS: Constitutional: no weight gain/loss, ++ fatigue, no subjective hyperthermia/hypothermia, + poor sleep, + excessive urination Eyes: no blurry vision, no xerophthalmia ENT: no sore throat, no nodules palpated in throat, no dysphagia/odynophagia, no hoarseness, + occas. tinnitus Cardiovascular: no CP/SOB/palpitations/leg swelling Respiratory: no cough/SOB Gastrointestinal: no N/V/+ D/+ C/+ bloating Musculoskeletal: no muscle/+ joint aches/+ bone pain (R arm). Skin: no rashes, + easy bruising Neurological: no  tremors/numbness/tingling/dizziness Psychiatric: + depression/+ anxiety  Past Medical History:  Diagnosis Date  . Anxiety   . 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 Depression   . Diverticulosis   . 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 Hyperlipidemia    runs around 245 - diet controlled, no meds  . Internal hemorrhoids   . Tubular adenoma of colon    Past Surgical History:  Procedure Laterality Date  . ABDOMINAL HYSTERECTOMY  08/2008   fibroid bleeding  . BREAST BIOPSY Right    Aug 2015-"tracker' placed for hyperplasia cells  . CERVICAL CONE BIOPSY    . IR RADIOLOGIST EVAL & MGMT  02/04/2017  . WISDOM TOOTH EXTRACTION     Social History   Social History  . Marital status: Married    Spouse name: N/A  . Number of children: 0   Occupational History  . Nurse, learning disability   Social History Main Topics  . Smoking status: Current Every Day Smoker    Packs/day: 1.00    Years: 30.00    Types: Cigarettes  . Smokeless tobacco: Never Used  . Alcohol use 4.2 oz/week    7 Glasses of wine per week  . Drug use: No  . Sexual activity: Yes    Birth control/ protection: Surgical     Comment: Abd Hysterectomy   Other Topics Concern  . Not on file   Social History Narrative   Occupation: Pharmacologist  Manager College Degree   Married   Regular exercise- no   Smoker    HH of 2   5 pets  2 dogs and cats.  One outside cat.    Moved from [place] from job   G0P0   Current Outpatient Prescriptions on File Prior to Visit  Medication Sig Dispense Refill  . atorvastatin (LIPITOR) 20 MG tablet Take 1 tablet (20 mg total) by mouth daily. 30 tablet 3  . Biotin 10 MG TABS Take by mouth. Reported on 09/20/2015    . citalopram (CELEXA) 10 MG tablet TAKE 1 TABLET BY MOUTH  DAILY 90 tablet 1  . COD LIVER OIL PO Take by mouth.      Marland Kitchen glucosamine-chondroitin 500-400 MG tablet Take 1 tablet by mouth 3 (three) times daily.      . MULTIPLE VITAMIN PO Take  by mouth. Reported on 09/20/2015    . Probiotic Product (ALIGN) 4 MG CAPS Take by mouth.      . Cholecalciferol (VITAMIN D3 PO) Take 1,000 Units by mouth daily.      No current facility-administered medications on file prior to visit.    Allergies  Allergen Reactions  . Metronidazole Rash   Family History  Problem Relation Age of Onset  . Heart failure Father        heart valve problem   . Hypertension Father   . Breast cancer Sister   . Crohn's disease Sister   . Heart failure Maternal Grandmother   . Colon cancer Neg Hx   . Esophageal cancer Neg Hx   . Rectal cancer Neg Hx   . Stomach cancer Neg Hx     PE: BP 124/72 (BP Location: Left Arm, Patient Position: Sitting)   Pulse 67   Ht 5\' 6"  (1.676 m)   Wt 137 lb (62.1 kg)   SpO2 98%   BMI 22.11 kg/m  Wt Readings from Last 3 Encounters:  04/15/17 137 lb (62.1 kg)  03/20/17 136 lb (61.7 kg)  03/14/17 136 lb (61.7 kg)   Constitutional: normal weight, in NAD. No kyphosis. Eyes: PERRLA, EOMI, no exophthalmos ENT: moist mucous membranes, no thyromegaly, no cervical lymphadenopathy Cardiovascular: RRR, No MRG Respiratory: CTA B Gastrointestinal: abdomen soft, NT, ND, BS+ Musculoskeletal: no deformities, strength intact in all 4 Skin: moist, warm, no rashes Neurological: no tremor with outstretched hands, DTR normal in all 4  Assessment: 1. Hypercalcemia/hyperparathyroidism  Plan: Patient has had several instances of elevated calcium, with the highest level being at 10.8. A corresponding intact PTH level was also high, at 82.  - no h/o vitamin D deficiency - latest vitamin D was 43 >> since then she stopped vit D supplement >> I advised her to restart it at same dose - + apparent complications from hypercalcemia: + nephrolithiasis, no known osteoporosis, no fractures. + abdominal pain, + depression, + bone pain. - I discussed with the patient about the physiology of calcium and parathyroid hormone, and possible side  effects from increased PTH, including kidney stones, osteoporosis, abdominal pain, etc. She was already aware of the possible complications (read parathyroid.com website >> she is convinced many of her sxs are related to this condition. - We discussed that we need to check whether her hyperparathyroidism is real (she will need to come off Biotin for at least a week as Biotin has been found to artificially elevate PTH) and also if it is primary (Familial hypercalcemic hypocalciuria or parathyroid adenoma) or secondary (to conditions like: vitamin D deficiency,  calcium malabsorption, hypercalciuria, renal insufficiency, etc.). - her vitamin D level is normal so we can proceed to investigate the parathyroid status >> after she stops Biotin (and also Cod Liver Oil)will check: calcium level intact PTH ([facility]) Magnesium Phosphorus vitamin D- 1,25 HO 24h urinary calcium/creatinine ratio - discussed and given written instructions for urine collection - We discussed possible consequences of hyperparathyroidism: ~1/3 pts will develop complications over 15 years (OP, nephrolithiasis). She already has nephrolithiasis. - If the tests indicate a parathyroid adenoma, she agrees with a referral to surgery.  - criteria for parathyroid surgery:  Increased calcium by more than 1 mg/dL above the upper limit of normal  Kidney ds.  Osteoporosis (or Vb fx) Age <28 years old Newer criteria (2013): High UCa >400 mg/d and increased stone risk by biochemical stone risk analysis Presence of nephrolithiasis or nephrocalcinosis Pt's preference!  - We may need to check a DEXA scan to see if she has osteoporosis (+ add a 33% distal radius for evaluation of cortical bone, which is predominantly affected by hyperparathyroidism).  - I will see the patient back in 3 months  Component     Latest Ref Rng & Units 04/21/2017  Glucose     65 - 99 mg/dL 89  BUN     6 - 24 mg/dL 11  Creatinine     0.57 - 1.00 mg/dL 0.85  GFR,  Est Non African American     >59 mL/min/1.73 78  GFR, Est African American     >59 mL/min/1.73 90  BUN/Creatinine Ratio     9 - 23 13  Sodium     134 - 144 mmol/L 140  Potassium     3.5 - 5.2 mmol/L 5.1  Chloride     96 - 106 mmol/L 104  CO2     20 - 29 mmol/L 20  Calcium     8.7 - 10.2 mg/dL 9.9  Phosphorus     2.3 - 4.6 mg/dL 2.8  Magnesium     1.5 - 2.5 mg/dL 2.2  Calcium, 24H Urine     mg/24 h 357 (H)  Creatinine, 24H Ur     0.50 - 2.15 g/24 h 1.14  PTH, Intact     15 - 65 pg/mL 95 (H)   UCa high. PTH high with normal calcium. I doubt that hypercalciuria is driving the PTH up (as she had high calcium levels before) but the other way around. Suspect primary hyperparathyroidism. Will d/w pt and refer to surgery.  Philemon Kingdom, MD PhD [facility] Endocr[facility]

## 2017-04-21 ENCOUNTER — Other Ambulatory Visit: Payer: Self-pay | Admitting: Internal Medicine

## 2017-04-21 ENCOUNTER — Other Ambulatory Visit (INDEPENDENT_AMBULATORY_CARE_PROVIDER_SITE_OTHER): Payer: 59

## 2017-04-21 DIAGNOSIS — E213 Hyperparathyroidism, unspecified: Secondary | ICD-10-CM | POA: Diagnosis not present

## 2017-04-21 DIAGNOSIS — Z7689 Persons encountering health services in other specified circumstances: Secondary | ICD-10-CM | POA: Diagnosis not present

## 2017-04-21 LAB — PHOSPHORUS: Phosphorus: 2.8 mg/dL (ref 2.3–4.6)

## 2017-04-21 LAB — MAGNESIUM: Magnesium: 2.2 mg/dL (ref 1.5–2.5)

## 2017-04-22 ENCOUNTER — Other Ambulatory Visit: Payer: Self-pay

## 2017-04-22 LAB — PARATHYROID HORMONE, INTACT (NO CA)

## 2017-04-22 LAB — BASIC METABOLIC PANEL WITH GFR

## 2017-04-22 LAB — CREATININE, URINE, 24 HOUR: CREATININE 24H UR: 1.14 g/(24.h) (ref 0.50–2.15)

## 2017-04-22 LAB — CALCIUM, URINE, 24 HOUR: Calcium, 24H Urine: 357 mg/24 h — ABNORMAL HIGH

## 2017-04-22 LAB — TIQ-NTM

## 2017-04-22 LAB — VITAMIN D 1,25 DIHYDROXY

## 2017-04-23 ENCOUNTER — Other Ambulatory Visit: Payer: Self-pay

## 2017-04-23 LAB — SPECIMEN STATUS REPORT

## 2017-04-23 LAB — VITAMIN D 25 HYDROXY (VIT D DEFICIENCY, FRACTURES): VIT D 25 HYDROXY: 44.6 ng/mL (ref 30.0–100.0)

## 2017-04-24 ENCOUNTER — Encounter: Payer: Self-pay | Admitting: Internal Medicine

## 2017-04-24 LAB — BMP8+EGFR
BUN / CREAT RATIO: 13 (ref 9–23)
BUN: 11 mg/dL (ref 6–24)
CALCIUM: 9.9 mg/dL (ref 8.7–10.2)
CHLORIDE: 104 mmol/L (ref 96–106)
CO2: 20 mmol/L (ref 20–29)
Creatinine, Ser: 0.85 mg/dL (ref 0.57–1.00)
GFR calc non Af Amer: 78 mL/min/{1.73_m2} (ref >59)
GFR, EST AFRICAN AMERICAN: 90 mL/min/{1.73_m2} (ref >59)
Glucose: 89 mg/dL (ref 65–99)
POTASSIUM: 5.1 mmol/L (ref 3.5–5.2)
SODIUM: 140 mmol/L (ref 134–144)

## 2017-04-24 LAB — SPECIMEN STATUS REPORT

## 2017-04-24 LAB — PARATHYROID HORMONE, INTACT (NO CA): PTH: 95 pg/mL — AB (ref 15–65)

## 2017-04-25 ENCOUNTER — Telehealth: Payer: Self-pay | Admitting: Internal Medicine

## 2017-04-25 NOTE — Telephone Encounter (Signed)
Patient called again and is inquiring  about lab results. Please call . Contact number is [phone]. Please advise.

## 2017-04-25 NOTE — Telephone Encounter (Signed)
CG-Patient has called about her lab results and asking to be given permission to view them via her MyChart/plz advise/thx dmf

## 2017-04-25 NOTE — Telephone Encounter (Signed)
Patient would like all recent lab results to be put in her MyChart today. Very interested in her calcium level,parathyroid-pth level,hormone and vitamin D level.She has been waiting all week. Patient wants to see her results before any consult with Dr. Harlow Asa

## 2017-04-25 NOTE — Telephone Encounter (Signed)
I just released them 30 min ago. We had a delay with the lab.

## 2017-04-28 NOTE — Telephone Encounter (Signed)
Patient called and states that she viewed her lab results and has questions. I looked at the lab results in her chart and it looks like there is a issue with the results and the patient seen them on her Mychart. She  is needing some clarification . Please call patient [phone]. Please advise. Thank you

## 2017-04-29 ENCOUNTER — Encounter: Payer: Self-pay | Admitting: Internal Medicine

## 2017-04-29 ENCOUNTER — Telehealth: Payer: Self-pay | Admitting: Internal Medicine

## 2017-04-29 NOTE — Telephone Encounter (Signed)
Pt would like pth intact and calcium blood work. Dr Renne Crigler order blood work and pt never received the results. I let pt know her insurance may not pay for labs again. Pt also would like bone density order

## 2017-04-30 ENCOUNTER — Encounter: Payer: Self-pay | Admitting: Internal Medicine

## 2017-04-30 ENCOUNTER — Other Ambulatory Visit: Payer: Self-pay | Admitting: Internal Medicine

## 2017-04-30 ENCOUNTER — Telehealth: Payer: Self-pay

## 2017-04-30 DIAGNOSIS — E213 Hyperparathyroidism, unspecified: Secondary | ICD-10-CM

## 2017-04-30 NOTE — Telephone Encounter (Signed)
Pt was requesting results of labs that Dr Renne Crigler ordered, she was having issues with getting these results.  FYI, pt is having a Dexa Scan soon. Nothing further needed for these requests.  Pt is requesting another PTH and Calcium lab to be drawn, wanted to know if Dr Regis Bill would order.  Please advise Dr Regis Bill, thanks.

## 2017-04-30 NOTE — Telephone Encounter (Signed)
Called and scheduled patient for DEXA scan. Advised it was Friday at 9:30, patient understood and had no issues.

## 2017-05-01 NOTE — Telephone Encounter (Signed)
Please send this info to  The patient    And tell her results if she still needs this Thanks

## 2017-05-01 NOTE — Telephone Encounter (Signed)
Not sure if she needs a repeat PTH or not.  It appears that the PTH and vitamin D through [facility] orders  were canceled so I do not think they were performed  She has had 2 PTH is done in the electronic record here one only 3 weeks ago  so probably not indicated to repeat it this early.  Please refer this ? And ask Dr. Darnell Level also if repeat PTH vitamin D is indicated or helpful at this time based on the electronic record review. Will be glad to help out with ordering   If needed.

## 2017-05-01 NOTE — Telephone Encounter (Signed)
Jessica Cooper, There was a problem with resulting the labs, however, they were resulted after we called them and clarified.  I tried to release the labs into my chart but the patient could not see them, therefore, I sent them to her in a separate message.  I also sent her a letter with all the results.  We do not need to repeat them.

## 2017-05-02 ENCOUNTER — Ambulatory Visit (INDEPENDENT_AMBULATORY_CARE_PROVIDER_SITE_OTHER)
Admission: RE | Admit: 2017-05-02 | Discharge: 2017-05-02 | Disposition: A | Discharge status: Home / Self Care | Payer: 59 | Source: Ambulatory Visit | Attending: Internal Medicine | Admitting: Internal Medicine

## 2017-05-02 DIAGNOSIS — E213 Hyperparathyroidism, unspecified: Secondary | ICD-10-CM | POA: Diagnosis not present

## 2017-05-02 NOTE — Telephone Encounter (Signed)
Communication through [facility] Will close this message

## 2017-05-05 ENCOUNTER — Other Ambulatory Visit: Payer: Self-pay | Admitting: Internal Medicine

## 2017-05-06 NOTE — Telephone Encounter (Signed)
Medication filled to pharmacy as requested.  Called patient and scheduled appt for follow-up labs per last result notes.

## 2017-05-12 DIAGNOSIS — E213 Hyperparathyroidism, unspecified: Secondary | ICD-10-CM | POA: Diagnosis not present

## 2017-05-12 DIAGNOSIS — E7800 Pure hypercholesterolemia, unspecified: Secondary | ICD-10-CM | POA: Diagnosis not present

## 2017-05-12 DIAGNOSIS — Z01818 Encounter for other preprocedural examination: Secondary | ICD-10-CM | POA: Diagnosis not present

## 2017-05-12 DIAGNOSIS — E210 Primary hyperparathyroidism: Secondary | ICD-10-CM | POA: Diagnosis not present

## 2017-05-12 DIAGNOSIS — D351 Benign neoplasm of parathyroid gland: Secondary | ICD-10-CM | POA: Diagnosis not present

## 2017-05-15 ENCOUNTER — Encounter: Payer: Self-pay | Admitting: Internal Medicine

## 2017-05-26 ENCOUNTER — Ambulatory Visit: Payer: 59 | Admitting: Internal Medicine

## 2017-05-27 ENCOUNTER — Other Ambulatory Visit (INDEPENDENT_AMBULATORY_CARE_PROVIDER_SITE_OTHER): Payer: 59

## 2017-05-27 DIAGNOSIS — E785 Hyperlipidemia, unspecified: Secondary | ICD-10-CM | POA: Diagnosis not present

## 2017-05-27 LAB — LIPID PANEL
CHOL/HDL RATIO: 3
Cholesterol: 206 mg/dL — ABNORMAL HIGH (ref 0–200)
HDL: 73.2 mg/dL (ref >39.00)
LDL CALC: 117 mg/dL — AB (ref 0–99)
NONHDL: 132.37
Triglycerides: 75 mg/dL (ref 0.0–149.0)
VLDL: 15 mg/dL (ref 0.0–40.0)

## 2017-06-20 ENCOUNTER — Other Ambulatory Visit: Payer: Self-pay | Admitting: Internal Medicine

## 2017-07-03 ENCOUNTER — Encounter: Payer: Self-pay | Admitting: *Deleted

## 2017-07-08 ENCOUNTER — Encounter: Payer: Self-pay | Admitting: Internal Medicine

## 2017-07-08 DIAGNOSIS — E213 Hyperparathyroidism, unspecified: Secondary | ICD-10-CM

## 2017-07-08 NOTE — Telephone Encounter (Signed)
Ok to order ipth  With calcium   Dx  Hyperparathyroid  And I would like some fu visit   After results back

## 2017-07-14 NOTE — Telephone Encounter (Signed)
Ordered Pt notified, aware to set up lab appt.  Nothing further needed.

## 2017-07-15 ENCOUNTER — Ambulatory Visit: Payer: 59 | Admitting: Internal Medicine

## 2017-07-15 ENCOUNTER — Encounter: Payer: Self-pay | Admitting: Internal Medicine

## 2017-07-15 VITALS — BP 110/78 | HR 60 | Temp 97.9°F | Wt 136.8 lb

## 2017-07-15 DIAGNOSIS — R1010 Upper abdominal pain, unspecified: Secondary | ICD-10-CM

## 2017-07-15 DIAGNOSIS — I700 Atherosclerosis of aorta: Secondary | ICD-10-CM | POA: Diagnosis not present

## 2017-07-15 DIAGNOSIS — Q433 Congenital malformations of intestinal fixation: Secondary | ICD-10-CM | POA: Diagnosis not present

## 2017-07-15 DIAGNOSIS — E213 Hyperparathyroidism, unspecified: Secondary | ICD-10-CM

## 2017-07-15 DIAGNOSIS — N200 Calculus of kidney: Secondary | ICD-10-CM | POA: Diagnosis not present

## 2017-07-15 NOTE — Progress Notes (Signed)
Chief Complaint  Patient presents with  . Follow-up    PTH labs today.     HPI: Jessica Cooper 56 y.o. comes in today about 11 weeks post surgical parathyroid adenoma removal x2.  She is currently on calcium supplementation.  citracal  2 per day .     Marland Kitchen     Minimally invasive  Surgery .  And healing well just slight tenderness to anterior neck   11 weeks  Post   Surgery .  And was told to follow-up with the PTH level at this time.  And we could adjust her calcium.  Sleeping   At times .   Memory loss.    Concerning for her.  That may be getting better.  Her bone pain is definitely better since the surgery.  She is on cholesterol medicine because of the incidental finding of calcific vascular calcification.  Although improved she still has some pain ache in the right side no acute symptoms.   Reviewed the abdominal CT scan with her done in the fall that was noted to have a partially malrotated midgut but no acute volvulus or obstruction.  On further questioning she does admit to having problems since her early 61s where she has episodes of severe bloating abdominal pain that is relieved when she lays down not associated with eating other activities.  His episodes in clusters may be 3 at a time and then goes away for long times.  No known reason for aggravation noted.  She has not been to clinicians for that.  ROS: See pertinent positives and negatives per HPI.  Past Medical History:  Diagnosis Date  . Anxiety   . 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 Depression   . Diverticulosis   . 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 Hyperlipidemia    runs around 245 - diet controlled, no meds  . Internal hemorrhoids   . Tubular adenoma of colon     Family History  Problem Relation Age of Onset  . Heart failure Father        heart valve problem   . Hypertension Father   . Breast cancer Sister   . Crohn's disease Sister   . Heart failure  Maternal Grandmother   . Colon cancer Neg Hx   . Esophageal cancer Neg Hx   . Rectal cancer Neg Hx   . Stomach cancer Neg Hx     Social History   Socioeconomic History  . Marital status: Married    Spouse name: None  . Number of children: None  . Years of education: None  . Highest education level: None  Social Needs  . Financial resource strain: None  . Food insecurity - worry: None  . Food insecurity - inability: None  . Transportation needs - medical: None  . Transportation needs - non-medical: None  Occupational History  . None  Tobacco Use  . Smoking status: Current Every Day Smoker    Packs/day: 1.00    Years: 30.00    Pack years: 30.00    Types: Cigarettes  . Smokeless tobacco: Never Used  Substance and Sexual Activity  . Alcohol use: Yes    Alcohol/week: 4.2 oz    Types: 7 Glasses of wine per week  . Drug use: No  . Sexual activity: Yes    Birth control/protection: Surgical    Comment: Abd Hysterectomy  Other Topics Concern  . None  Social History Narrative   Occupation: Research scientist (medical)   Married   Regular exercise- no   Smoker    HH of 2   5 pets  2 dogs and cats.  One outside cat.    Moved from [place] from job   G0P0    Outpatient Medications Prior to Visit  Medication Sig Dispense Refill  . Calcium Citrate (CITRACAL PO) Take by mouth 2 (two) times daily.    Marland Kitchen atorvastatin (LIPITOR) 20 MG tablet TAKE 1 TABLET BY MOUTH EVERY DAY 30 tablet 3  . Biotin 10 MG TABS Take by mouth. Reported on 09/20/2015    . Cholecalciferol (VITAMIN D3 PO) Take 1,000 Units by mouth daily.     . citalopram (CELEXA) 10 MG tablet TAKE 1 TABLET BY MOUTH  DAILY 90 tablet 0  . COD LIVER OIL PO Take by mouth.      Marland Kitchen glucosamine-chondroitin 500-400 MG tablet Take 1 tablet by mouth 3 (three) times daily.      . MULTIPLE VITAMIN PO Take by mouth. Reported on 09/20/2015    . Probiotic Product (ALIGN) 4 MG CAPS Take by mouth.       No facility-administered  medications prior to visit.      EXAM:  BP 110/78 (BP Location: Right Arm, Patient Position: Sitting, Cuff Size: Normal)   Pulse 60   Temp 97.9 F (36.6 C) (Oral)   Wt 136 lb 12.8 oz (62.1 kg)   BMI 22.08 kg/m   Body mass index is 22.08 kg/m.  GENERAL: vitals reviewed and listed above, alert, oriented, appears well hydrated and in no acute distress HEENT: atraumatic, conjunctiva  clear, no obvious abnormalities on inspection of external nose and ears   NECK: no obvious masses on inspection palpation  Healing scar  Lower neck  MS: moves all extremities without noticeable focal  abnormality PSYCH: pleasant and cooperative, no obvious depression or anxiety  ASSESSMENT AND PLAN:  Discussed the following assessment and plan:  Hyperparathyroidism (HCC) - sp removal adenoma x 2 11 weeks out on citrical bid follow  - Plan: PTH, Intact and Calcium  Intermittent upper abdominal pain - 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  Consideration of imaging during these episodes because of her malrotation partial on CT scan incidental finding.  -Patient advised to return or notify health care team  if symptoms worsen ,persist or new concerns arise.  Patient Instructions  Will let   You know  Result when back    If gets    Repeated abd  pain attacks can see medical care .    Imagining can tell if having intermittent     Bowel twisting  .        Standley Brooking. Panosh M.D.  FINDINGS: Lower Chest: No acute findings.  Hepatobiliary: No hepatic masses identified. A few tiny sub-cm hepatic cysts are noted. Focal fatty  infiltration noted adjacent to the falciform ligament. Gallbladder is unremarkable.  Pancreas:  No mass or inflammatory changes.  Spleen: Within normal limits in size and appearance.  Adrenals/Urinary Tract: No masses identified. Simple cyst noted in  upper pole of right kidney. A few tiny 1-2 mm nonobstructing calculi are seen in the upper and mid poles of the right kidney. No evidence of ureteral calculi or hydronephrosis.  Stomach/Bowel: No evidence of obstruction, inflammatory process or abnormal fluid collections. Partial midgut malrotation noted with proximal small bowel loops in the right abdomen. Normal position of cecum noted in right pelvis. Normal appendix visualized.  Vascular/Lymphatic: No pathologically enlarged lymph nodes. No abdominal aortic aneurysm. Aortic atherosclerosis.  Reproductive: Prior hysterectomy noted. Adnexal regions are unremarkable in appearance.  Other:  None.  Musculoskeletal:  No suspicious bone lesions identified.  IMPRESSION: No acute findings.  Tiny nonobstructing right renal calculi.  Congenital partial midgut malrotation. No evidence of volvulus or obstruction.   Electronically Signed   By: Earle Gell M.D.   On: 03/24/2017 21:58

## 2017-07-15 NOTE — Patient Instructions (Signed)
Will let   You know  Result when back    If gets    Repeated abd  pain attacks can see medical care .    Imagining can tell if having intermittent     Bowel twisting  .

## 2017-07-16 ENCOUNTER — Ambulatory Visit: Payer: 59 | Admitting: Internal Medicine

## 2017-07-16 LAB — PTH, INTACT AND CALCIUM
CALCIUM: 9.5 mg/dL (ref 8.6–10.4)
PTH: 36 pg/mL (ref 14–64)

## 2017-08-15 DIAGNOSIS — R079 Chest pain, unspecified: Secondary | ICD-10-CM | POA: Diagnosis not present

## 2017-08-15 DIAGNOSIS — I774 Celiac artery compression syndrome: Secondary | ICD-10-CM | POA: Diagnosis not present

## 2017-08-15 DIAGNOSIS — R0602 Shortness of breath: Secondary | ICD-10-CM | POA: Diagnosis not present

## 2017-08-15 DIAGNOSIS — R05 Cough: Secondary | ICD-10-CM | POA: Diagnosis not present

## 2017-08-15 DIAGNOSIS — J168 Pneumonia due to other specified infectious organisms: Secondary | ICD-10-CM | POA: Diagnosis not present

## 2017-08-15 DIAGNOSIS — I2584 Coronary atherosclerosis due to calcified coronary lesion: Secondary | ICD-10-CM | POA: Diagnosis not present

## 2017-08-18 ENCOUNTER — Ambulatory Visit: Payer: 59 | Admitting: Internal Medicine

## 2017-08-18 ENCOUNTER — Encounter: Payer: Self-pay | Admitting: Internal Medicine

## 2017-08-18 VITALS — BP 116/68 | HR 59 | Temp 98.4°F | Wt 139.4 lb

## 2017-08-18 DIAGNOSIS — I774 Celiac artery compression syndrome: Secondary | ICD-10-CM

## 2017-08-18 DIAGNOSIS — J189 Pneumonia, unspecified organism: Secondary | ICD-10-CM

## 2017-08-18 DIAGNOSIS — J181 Lobar pneumonia, unspecified organism: Secondary | ICD-10-CM

## 2017-08-18 DIAGNOSIS — I2510 Atherosclerotic heart disease of native coronary artery without angina pectoris: Secondary | ICD-10-CM | POA: Diagnosis not present

## 2017-08-18 DIAGNOSIS — F17200 Nicotine dependence, unspecified, uncomplicated: Secondary | ICD-10-CM

## 2017-08-18 DIAGNOSIS — Z79899 Other long term (current) drug therapy: Secondary | ICD-10-CM

## 2017-08-18 MED ORDER — DOXYCYCLINE HYCLATE 100 MG PO TABS: 100.0000 mg | ORAL_TABLET | Freq: Two times a day (BID) | ORAL | 0 refills | Status: DC

## 2017-08-18 NOTE — Progress Notes (Signed)
Chief Complaint  Patient presents with  . Hospitalization Follow-up    08/15/17 seen by ED, fluids given for dehydration and abx. Pt diagnosed with PNA. Pt having diarrhea x 2 days, likely related to abx. CT scan showed abnormalities.     HPI: Jessica Cooper 57 y.o. come in for SDA appointment because of emergency room evaluation for acute left-sided stitch inside type pain and then developing weakness and lightheadedness.  Onset  Hitch in side and hurt to breath   So  Feb 21  Couldn't  finished drying  Hair    flet like going to pass out.  She had no fever but did have a history of a cough.  She was evaluated with a chest x-ray and then a chest CT showed no pulmonary embolus but was diagnosed is a small early pneumonia in the left upper lobe.  Other findings on the cyst chest CT showed coronary artery calcium and some celiac acid axis suppression possibility.  She is feeling better than she did last week but is taking Augmentin twice a day and today has had 2 diarrheal stools but no blood vomiting. Laboratory reports are in care everywhere. No bleeding.  ROS: See pertinent positives and negatives per HPI.  No hemoptysis current abdominal pain.  Of note see past notes of episodic abdominal pain that resolves with laying flat.  Her previous CT showed what she called malrotation.  The current CT as reported.   Mid afternoon episode of bloating pain better with supine.   No vomiting   Past Medical History:  Diagnosis Date  . Anxiety   . 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 Depression   . Diverticulosis   . 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 Hyperlipidemia    runs around 245 - diet controlled, no meds  . Internal hemorrhoids   . Tubular adenoma of colon     Family History  Problem Relation Age of Onset  . Heart failure Father        heart valve problem   . Hypertension Father   . Breast cancer Sister   . Crohn's disease Sister   .  Heart failure Maternal Grandmother   . Colon cancer Neg Hx   . Esophageal cancer Neg Hx   . Rectal cancer Neg Hx   . Stomach cancer Neg Hx     Social History   Socioeconomic History  . Marital status: Married    Spouse name: None  . Number of children: None  . Years of education: None  . Highest education level: None  Social Needs  . Financial resource strain: None  . Food insecurity - worry: None  . Food insecurity - inability: None  . Transportation needs - medical: None  . Transportation needs - non-medical: None  Occupational History  . None  Tobacco Use  . Smoking status: Current Every Day Smoker    Packs/day: 1.00    Years: 30.00    Pack years: 30.00    Types: Cigarettes  . Smokeless tobacco: Never Used  Substance and Sexual Activity  . Alcohol use: Yes    Alcohol/week: 4.2 oz    Types: 7 Glasses of wine per week  . Drug use: No  . Sexual activity: Yes    Birth control/protection: Surgical    Comment: Abd Hysterectomy  Other Topics Concern  . None  Social History Narrative   Occupation: Research scientist (medical)  Married   Regular exercise- no   Smoker    HH of 2   5 pets  2 dogs and cats.  One outside cat.    Moved from [place] from job   G0P0    Outpatient Medications Prior to Visit  Medication Sig Dispense Refill  . atorvastatin (LIPITOR) 20 MG tablet TAKE 1 TABLET BY MOUTH EVERY DAY 30 tablet 3  . Biotin 10 MG TABS Take by mouth. Reported on 09/20/2015    . Calcium Citrate (CITRACAL PO) Take by mouth 2 (two) times daily.    . Cholecalciferol (VITAMIN D3 PO) Take 1,000 Units by mouth daily.     . citalopram (CELEXA) 10 MG tablet TAKE 1 TABLET BY MOUTH  DAILY 90 tablet 0  . COD LIVER OIL PO Take by mouth.      Marland Kitchen glucosamine-chondroitin 500-400 MG tablet Take 1 tablet by mouth 3 (three) times daily.      . MULTIPLE VITAMIN PO Take by mouth. Reported on 09/20/2015    . Probiotic Product (ALIGN) 4 MG CAPS Take by mouth.       No  facility-administered medications prior to visit.      EXAM:  BP 116/68 (BP Location: Right Arm, Patient Position: Sitting, Cuff Size: Normal)   Pulse (!) 59   Temp 98.4 F (36.9 C) (Oral)   Wt 139 lb 6.4 oz (63.2 kg)   SpO2 98%   BMI 22.50 kg/m   Body mass index is 22.5 kg/m.  GENERAL: vitals reviewed and listed above, alert, oriented, appears well hydrated and in no acute distress HEENT: atraumatic, conjunctiva  clear, no obvious abnormalities on inspection of external nose and ears OP : no lesion edema or exudate  NECK: no obvious masses on inspection palpation  LUNGS: clear to auscultation bilaterally, no wheezes, rales or rhonchi, good air movement CV: HRRR, no clubbing cyanosis or  peripheral edema nl cap refill  Abdomen soft without organomegaly guarding or rebound. MS: moves all extremities without noticeable focal  abnormality PSYCH: pleasant and cooperative, no obvious depression or anxiety Lab Results  Component Value Date   WBC 7.2 03/20/2017   HGB 14.8 03/20/2017   HCT 43.4 03/20/2017   PLT 217.0 03/20/2017   GLUCOSE CANCELED 04/21/2017   CHOL 206 (H) 05/27/2017   TRIG 75.0 05/27/2017   HDL 73.20 05/27/2017   LDLDIRECT 178.9 09/18/2012   LDLCALC 117 (H) 05/27/2017   ALT 27 03/20/2017   AST 22 03/20/2017   NA 140 04/21/2017   K 5.1 04/21/2017   CL 104 04/21/2017   CREATININE 0.85 04/21/2017   BUN 11 04/21/2017   CO2 20 04/21/2017   TSH 1.31 12/14/2015   INR 0.90 02/07/2017   HGBA1C 5.1 12/06/2014   BP Readings from Last 3 Encounters:  08/18/17 116/68  07/15/17 110/78  04/15/17 124/72    ASSESSMENT AND PLAN:  Discussed the following assessment and plan:  Pneumonia of left upper lobe due to infectious organism [facility])  Coronary artery calcification seen on CAT scan  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  Continue Augmentin can try her probiotic if diarrhea is to overwhelming she can try switching to the doxycycline for 5 days. We will plan follow-up next week out of work because of fatigue In regard to coronary  artery calcification will probably need more aggressive management of her lipids she has had a stress test in the past and has no symptoms apparently. 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  In retrospect listening to some of her symptoms.  We will address that also in the future. Stay hydrated over the next week contact us in the interim if needed. Plan further  Evaluation as  Appropriate as indicated .  Vascular  Etc.  -Patient advised to return or notify health care team  if  new concerns arise. Total visit 40 mins > 50% spent counseling and coordinating care as indicated in above note and in instructions to patient .     Patient Instructions   Need to  Stay on   Antibiotic  And we can change the  Type.  If needed  Could try a probitoic     May decrease diarrhea risk   When better plan to increase the  Atorvastatin to 40 mg  And then recheck levels in 3 month s  Stomach wise we may need to  get a consult .  Because of the ? Of malrotation and celiac axis compression .  ROV in a week or as needed.  Not ready for work until  End of week IF [place] .    ROV  Next week     Standley Brooking. Panosh M.D.

## 2017-08-18 NOTE — Patient Instructions (Addendum)
  Need to  Stay on   Antibiotic  And we can change the  Type.  If needed  Could try a probitoic     May decrease diarrhea risk   When better plan to increase the  Atorvastatin to 40 mg  And then recheck levels in 3 month s  Stomach wise we may need to  get a consult .  Because of the ? Of malrotation and celiac axis compression .  ROV in a week or as needed.  Not ready for work until  End of week IF [place] .    ROV  Next week

## 2017-08-20 ENCOUNTER — Encounter: Payer: Self-pay | Admitting: Internal Medicine

## 2017-08-20 NOTE — Telephone Encounter (Signed)
Pt would like to make sure Dr Regis Bill sees the [place] message from the pt.  Pt states she would like the note by Friday, stating out until 3// going back 1/2 days next week.starting 3/4-/3/8.  Then back to full time after that.

## 2017-08-22 NOTE — Telephone Encounter (Signed)
°  Pt. Is needing the note for work on Monday 3/4 to work 1/2 days next week.     If possible can send by [facility] or fax # [phone]

## 2017-08-22 NOTE — Telephone Encounter (Signed)
Replied to patient Letter attached within message back to patient. Nothing further needed.

## 2017-08-22 NOTE — Telephone Encounter (Signed)
Please  Write note as  Requested    Sorry   This is late .  wasout one day this week

## 2017-08-27 ENCOUNTER — Ambulatory Visit: Payer: 59 | Admitting: Internal Medicine

## 2017-08-27 ENCOUNTER — Encounter: Payer: Self-pay | Admitting: Internal Medicine

## 2017-08-27 VITALS — BP 98/68 | HR 61 | Temp 98.2°F | Wt 136.3 lb

## 2017-08-27 DIAGNOSIS — J181 Lobar pneumonia, unspecified organism: Secondary | ICD-10-CM | POA: Diagnosis not present

## 2017-08-27 DIAGNOSIS — I2510 Atherosclerotic heart disease of native coronary artery without angina pectoris: Secondary | ICD-10-CM | POA: Diagnosis not present

## 2017-08-27 DIAGNOSIS — J189 Pneumonia, unspecified organism: Secondary | ICD-10-CM

## 2017-08-27 DIAGNOSIS — I774 Celiac artery compression syndrome: Secondary | ICD-10-CM | POA: Diagnosis not present

## 2017-08-27 DIAGNOSIS — Q433 Congenital malformations of intestinal fixation: Secondary | ICD-10-CM | POA: Diagnosis not present

## 2017-08-27 DIAGNOSIS — R1010 Upper abdominal pain, unspecified: Secondary | ICD-10-CM

## 2017-08-27 NOTE — Patient Instructions (Addendum)
Continue  4 hours limited days work until March 18th .    The ROV    March 27 .  I will put  in referral about the    Possibility of celiac artery compression.  We wil finish the  FMLA form .    Marland Kitchen

## 2017-08-27 NOTE — Progress Notes (Signed)
Chief Complaint  Patient presents with  . Follow-up    PNA - Still having headache and dizziness  . Form Completion    HPI: Jessica Cooper 57 y.o. come in for fu pna   No more left chest pain but  Still tired  And mild headache     4 hours per day  Week . As of this Monday  Trying to decrease tobacco  No recent abd pain except at the illness. No new sx . finifhed med  ROS: See pertinent positives and negatives per HPI.  Past Medical History:  Diagnosis Date  . Anxiety   . 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 Depression   . Diverticulosis   . 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 Hyperlipidemia    runs around 245 - diet controlled, no meds  . Internal hemorrhoids   . Tubular adenoma of colon     Family History  Problem Relation Age of Onset  . Heart failure Father        heart valve problem   . Hypertension Father   . Breast cancer Sister   . Crohn's disease Sister   . Heart failure Maternal Grandmother   . Colon cancer Neg Hx   . Esophageal cancer Neg Hx   . Rectal cancer Neg Hx   . Stomach cancer Neg Hx     Social History   Socioeconomic History  . Marital status: Married    Spouse name: None  . Number of children: None  . Years of education: None  . Highest education level: None  Social Needs  . Financial resource strain: None  . Food insecurity - worry: None  . Food insecurity - inability: None  . Transportation needs - medical: None  . Transportation needs - non-medical: None  Occupational History  . None  Tobacco Use  . Smoking status: Current Every Day Smoker    Packs/day: 1.00    Years: 30.00    Pack years: 30.00    Types: Cigarettes  . Smokeless tobacco: Never Used  Substance and Sexual Activity  . Alcohol use: Yes    Alcohol/week: 4.2 oz    Types: 7 Glasses of wine per week  . Drug use: No  . Sexual activity: Yes    Birth control/protection: Surgical    Comment: Abd Hysterectomy  Other  Topics Concern  . None  Social History Narrative   Occupation: Research scientist (medical)   Married   Regular exercise- no   Smoker    HH of 2   5 pets  2 dogs and cats.  One outside cat.    Moved from [place] from job   G0P0    Outpatient Medications Prior to Visit  Medication Sig Dispense Refill  . atorvastatin (LIPITOR) 20 MG tablet TAKE 1 TABLET BY MOUTH EVERY DAY 30 tablet 3  . Biotin 10 MG TABS Take by mouth. Reported on 09/20/2015    . Calcium Citrate (CITRACAL PO) Take by mouth 2 (two) times daily.    . Cholecalciferol (VITAMIN D3 PO) Take 1,000 Units by mouth daily.     . citalopram (CELEXA) 10 MG tablet TAKE 1 TABLET BY MOUTH  DAILY 90 tablet 0  . COD LIVER OIL PO Take by mouth.      Marland Kitchen glucosamine-chondroitin 500-400 MG tablet Take 1 tablet by mouth 3 (three) times daily.      . MULTIPLE VITAMIN PO  Take by mouth. Reported on 09/20/2015    . Probiotic Product (ALIGN) 4 MG CAPS Take by mouth.      . doxycycline (VIBRA-TABS) 100 MG tablet Take 1 tablet (100 mg total) by mouth 2 (two) times daily. (Patient not taking: Reported on 08/27/2017) 14 tablet 0   No facility-administered medications prior to visit.      EXAM:  BP 98/68 (BP Location: Right Arm, Patient Position: Sitting, Cuff Size: Normal)   Pulse 61   Temp 98.2 F (36.8 C) (Oral)   Wt 136 lb 4.8 oz (61.8 kg)   BMI 22.00 kg/m   Body mass index is 22 kg/m.  GENERAL: vitals reviewed and listed above, alert, oriented, appears well hydrated and in no acute distress HEENT: atraumatic, conjunctiva  clear, no obvious abnormalities on inspection of external nose and ears OP : no lesion edema or exudate  NECK: no obvious masses on inspection palpation  LUNGS: clear to auscultation bilaterally, no wheezes, rales or rhonchi, good air movement CV: HRRR, no clubbing cyanosis or  peripheral edema nl cap refill  Abdomen:  Sof,t normal bowel sounds without hepatosplenomegaly, no guarding rebound or masses no CVA  tenderness MS: moves all extremities without noticeable focal  abnormality PSYCH: pleasant and cooperative, no obvious depression or anxiety Lab Results  Component Value Date   WBC 7.2 03/20/2017   HGB 14.8 03/20/2017   HCT 43.4 03/20/2017   PLT 217.0 03/20/2017   GLUCOSE CANCELED 04/21/2017   CHOL 206 (H) 05/27/2017   TRIG 75.0 05/27/2017   HDL 73.20 05/27/2017   LDLDIRECT 178.9 09/18/2012   LDLCALC 117 (H) 05/27/2017   ALT 27 03/20/2017   AST 22 03/20/2017   NA 140 04/21/2017   K 5.1 04/21/2017   CL 104 04/21/2017   CREATININE 0.85 04/21/2017   BUN 11 04/21/2017   CO2 20 04/21/2017   TSH 1.31 12/14/2015   INR 0.90 02/07/2017   HGBA1C 5.1 12/06/2014   BP Readings from Last 3 Encounters:  08/27/17 98/68  08/18/17 116/68  07/15/17 110/78    ASSESSMENT AND PLAN:  Discussed the following assessment and plan:  Pneumonia of left upper lobe due to infectious organism ([place]) - imporved has fmla form  cont 4 hours day until march 18  fu in 3 27  Coronary artery calcification seen on CAT scan  Celiac axis compression syndrome (HCC) ?? - no pain at this time but hx of episode  sx  plan get  non urgent appt  vascular   next step  GI also  are her abd episode from abd ? 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  Patient Instructions  Continue  4 hours limited days work until March 18th .    The ROV    March 27 .  I will put  in referral about the    Possibility of celiac artery compression.  We wil finish the  FMLA form .    Marland Kitchen      Standley Brooking. Panosh M.D.

## 2017-08-28 ENCOUNTER — Encounter: Payer: Self-pay | Admitting: Internal Medicine

## 2017-08-28 ENCOUNTER — Telehealth: Payer: Self-pay | Admitting: *Deleted

## 2017-08-28 NOTE — Progress Notes (Signed)
Yes, I would agree with increasing the atorvastatin dose to achieve target LDL<100. If I understand correctly, her upper abdominal pain is not exertional, making it unlikely to be angina. As far as the abdominal abnormalities, I will plead ignorance and defer to our vascular colleagues. MCr

## 2017-08-28 NOTE — Telephone Encounter (Signed)
Form is about 95% complete.  Placed in Dr Regis Bill red folder to complete and sign.  This will be faxed today once complete.   Please advise Dr Regis Bill, thanks.

## 2017-08-28 NOTE — Telephone Encounter (Signed)
Copied from [place] [phone] [id number]. Topic: General - Other >> Aug 28, 2017  9:38 AM Garvey Westcott Stare wrote:  Pt call to say her job is asking that the [facility] paper work be faxed back today  [phone]

## 2017-08-28 NOTE — Telephone Encounter (Signed)
Pt aware that forms completed, faxed back to [place] Copy sent to scan.  Originals placed up front to be picked up by pt.  Nothing further needed.

## 2017-09-04 ENCOUNTER — Other Ambulatory Visit: Payer: Self-pay | Admitting: Internal Medicine

## 2017-09-17 ENCOUNTER — Ambulatory Visit: Payer: 59 | Admitting: Internal Medicine

## 2017-09-17 ENCOUNTER — Encounter: Payer: Self-pay | Admitting: Internal Medicine

## 2017-09-17 VITALS — BP 108/72 | HR 61 | Temp 98.4°F | Wt 136.0 lb

## 2017-09-17 DIAGNOSIS — M25511 Pain in right shoulder: Secondary | ICD-10-CM | POA: Diagnosis not present

## 2017-09-17 DIAGNOSIS — Z79899 Other long term (current) drug therapy: Secondary | ICD-10-CM

## 2017-09-17 DIAGNOSIS — J189 Pneumonia, unspecified organism: Secondary | ICD-10-CM

## 2017-09-17 DIAGNOSIS — I2510 Atherosclerotic heart disease of native coronary artery without angina pectoris: Secondary | ICD-10-CM | POA: Diagnosis not present

## 2017-09-17 DIAGNOSIS — M25611 Stiffness of right shoulder, not elsewhere classified: Secondary | ICD-10-CM

## 2017-09-17 DIAGNOSIS — E785 Hyperlipidemia, unspecified: Secondary | ICD-10-CM | POA: Diagnosis not present

## 2017-09-17 DIAGNOSIS — I700 Atherosclerosis of aorta: Secondary | ICD-10-CM | POA: Diagnosis not present

## 2017-09-17 DIAGNOSIS — Z0000 Encounter for general adult medical examination without abnormal findings: Secondary | ICD-10-CM | POA: Diagnosis not present

## 2017-09-17 DIAGNOSIS — J181 Lobar pneumonia, unspecified organism: Secondary | ICD-10-CM | POA: Diagnosis not present

## 2017-09-17 MED ORDER — ATORVASTATIN CALCIUM 40 MG PO TABS: 40.0000 mg | ORAL_TABLET | Freq: Every day | ORAL | 2 refills | Status: DC

## 2017-09-17 NOTE — Progress Notes (Signed)
Chief Complaint  Patient presents with  . Follow-up    cap, lipids etc     HPI: Jessica Cooper 57 y.o. come in for  Fu pna  CAP  abd pain poss  recurranct with abn celiac axis  On ct scan    Low level dizziness     Not bad   Low level dizziness .   ? Vertigo no limiting  No neuro sx  energy much better and no sob cp   Breathing is good .  no chest pain   : See pertinent positives and negatives per HPI.  Has appt with vascular about the abd pain  And celiac artery  Finding   FYI right shoulder upper pain for a while and dec rom  Cannot raise above head   Past Medical History:  Diagnosis Date  . Anxiety   . 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 Depression   . Diverticulosis   . 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 Hyperlipidemia    runs around 245 - diet controlled, no meds  . Internal hemorrhoids   . Tubular adenoma of colon     Family History  Problem Relation Age of Onset  . Heart failure Father        heart valve problem   . Hypertension Father   . Breast cancer Sister   . Crohn's disease Sister   . Heart failure Maternal Grandmother   . Colon cancer Neg Hx   . Esophageal cancer Neg Hx   . Rectal cancer Neg Hx   . Stomach cancer Neg Hx     Social History   Socioeconomic History  . Marital status: Married    Spouse name: Not on file  . Number of children: Not on file  . Years of education: Not on file  . Highest education level: Not on file  Occupational History  . Not on file  Social Needs  . Financial resource strain: Not on file  . Food insecurity:    Worry: Not on file    Inability: Not on file  . Transportation needs:    Medical: Not on file    Non-medical: Not on file  Tobacco Use  . Smoking status: Current Every Day Smoker    Packs/day: 1.00    Years: 30.00    Pack years: 30.00    Types: Cigarettes  . Smokeless tobacco: Never Used  Substance and Sexual Activity  . Alcohol use: Yes   Alcohol/week: 4.2 oz    Types: 7 Glasses of wine per week  . Drug use: No  . Sexual activity: Yes    Birth control/protection: Surgical    Comment: Abd Hysterectomy  Lifestyle  . Physical activity:    Days per week: Not on file    Minutes per session: Not on file  . Stress: Not on file  Relationships  . Social connections:    Talks on phone: Not on file    Gets together: Not on file    Attends religious service: Not on file    Active member of club or organization: Not on file    Attends meetings of clubs or organizations: Not on file    Relationship status: Not on file  Other Topics Concern  . Not on file  Social History Narrative   Occupation: Chiropodist Degree   Married   Regular exercise- no   Smoker  HH of 2   5 pets  2 dogs and cats.  One outside cat.    Moved from [place] from job   G0P0    Outpatient Medications Prior to Visit  Medication Sig Dispense Refill  . Biotin 10 MG TABS Take by mouth. Reported on 09/20/2015    . Calcium Citrate (CITRACAL PO) Take by mouth 2 (two) times daily.    . Cholecalciferol (VITAMIN D3 PO) Take 1,000 Units by mouth daily.     . citalopram (CELEXA) 10 MG tablet TAKE 1 TABLET BY MOUTH  DAILY 90 tablet 0  . COD LIVER OIL PO Take by mouth.      Marland Kitchen glucosamine-chondroitin 500-400 MG tablet Take 1 tablet by mouth 3 (three) times daily.      . MULTIPLE VITAMIN PO Take by mouth. Reported on 09/20/2015    . Probiotic Product (ALIGN) 4 MG CAPS Take by mouth.      Marland Kitchen atorvastatin (LIPITOR) 20 MG tablet TAKE 1 TABLET BY MOUTH EVERY DAY 30 tablet 3   No facility-administered medications prior to visit.      EXAM:  BP 108/72 (BP Location: Left Arm, Patient Position: Sitting, Cuff Size: Normal)   Pulse 61   Temp 98.4 F (36.9 C) (Oral)   Wt 136 lb (61.7 kg)   SpO2 97%   BMI 21.95 kg/m   Body mass index is 21.95 kg/m.  GENERAL: vitals reviewed and listed above, alert, oriented, appears well hydrated and in no acute  distress HEENT: atraumatic, conjunctiva  clear, no obvious abnormalities on inspection of external nose and ears eoms normal   NECK: no obvious masses on inspection palpation  LUNGS: clear to auscultation bilaterally, no wheezes, rales or rhonchi, good air movement CV: HRRR, no clubbing cyanosis or  peripheral edema nl cap refill  MS: moves all extremities without noticeable focal  Abnormality  righ shoulder  Tender  Ac are deltoid and dec rom  90 degrees on right  PSYCH: pleasant and cooperative, no obvious depression or anxiety Lab Results  Component Value Date   WBC 7.2 03/20/2017   HGB 14.8 03/20/2017   HCT 43.4 03/20/2017   PLT 217.0 03/20/2017   GLUCOSE CANCELED 04/21/2017   CHOL 206 (H) 05/27/2017   TRIG 75.0 05/27/2017   HDL 73.20 05/27/2017   LDLDIRECT 178.9 09/18/2012   LDLCALC 117 (H) 05/27/2017   ALT 27 03/20/2017   AST 22 03/20/2017   NA 140 04/21/2017   K 5.1 04/21/2017   CL 104 04/21/2017   CREATININE 0.85 04/21/2017   BUN 11 04/21/2017   CO2 20 04/21/2017   TSH 1.31 12/14/2015   INR 0.90 02/07/2017   HGBA1C 5.1 12/06/2014   BP Readings from Last 3 Encounters:  09/17/17 108/72  08/27/17 98/68  08/18/17 116/68    ASSESSMENT AND PLAN:  Discussed the following assessment and plan:  Pneumonia of left upper lobe due to infectious organism [facility])  Coronary artery calcification seen on CAT scan - Plan: Lipid panel, Comprehensive metabolic panel, CBC with Differential/Platelet  Medication management - Plan: Lipid panel, Comprehensive metabolic panel, CBC with Differential/Platelet  Hyperlipidemia, unspecified hyperlipidemia type - intenisfy med   goal below 100 ldl   70 better - Plan: Lipid panel, Comprehensive metabolic panel, CBC with Differential/Platelet  Atherosclerosis of aorta (HCC) - Plan: Comprehensive metabolic panel, CBC with Differential/Platelet  Visit for preventive health examination lab order pre visit - Plan: Comprehensive metabolic panel, CBC  with Differential/Platelet  Right shoulder pain, unspecified chronicity -  w dec mobility  - Plan: Ambulatory referral to Orthopedic Surgery  Decreased shoulder mobility, right - Plan: Ambulatory referral to Orthopedic Surgery  -Patient advised to return or notify health care team  if  new concerns arise.  Patient Instructions  Will do a referral to sports med/ ortho.  About the shoulder .  Increase    Atorvastatin to 40 mg per day .   Will send in new rx   [facility]   Plan  Lab   Pre cpx    Fasting lipid    cpx   In  6 months or so .       Standley Brooking. Panosh M.D.

## 2017-09-17 NOTE — Patient Instructions (Addendum)
Will do a referral to sports med/ ortho.  About the shoulder .  Increase    Atorvastatin to 40 mg per day .   Will send in new rx   [facility]   Plan  Lab   Pre cpx    Fasting lipid    cpx   In  6 months or so .

## 2017-09-18 NOTE — Telephone Encounter (Signed)
Will send to Dr Panosh as FYI 

## 2017-09-23 DIAGNOSIS — L821 Other seborrheic keratosis: Secondary | ICD-10-CM | POA: Diagnosis not present

## 2017-09-23 DIAGNOSIS — L820 Inflamed seborrheic keratosis: Secondary | ICD-10-CM | POA: Diagnosis not present

## 2017-09-23 DIAGNOSIS — C44319 Basal cell carcinoma of skin of other parts of face: Secondary | ICD-10-CM | POA: Diagnosis not present

## 2017-09-23 DIAGNOSIS — D485 Neoplasm of uncertain behavior of skin: Secondary | ICD-10-CM | POA: Diagnosis not present

## 2017-09-23 DIAGNOSIS — D2362 Other benign neoplasm of skin of left upper limb, including shoulder: Secondary | ICD-10-CM | POA: Diagnosis not present

## 2017-09-23 DIAGNOSIS — L905 Scar conditions and fibrosis of skin: Secondary | ICD-10-CM | POA: Diagnosis not present

## 2017-10-01 ENCOUNTER — Ambulatory Visit (INDEPENDENT_AMBULATORY_CARE_PROVIDER_SITE_OTHER): Payer: 59 | Admitting: Orthopedic Surgery

## 2017-10-20 ENCOUNTER — Other Ambulatory Visit: Payer: Self-pay

## 2017-10-20 DIAGNOSIS — R109 Unspecified abdominal pain: Secondary | ICD-10-CM

## 2017-10-21 ENCOUNTER — Ambulatory Visit (INDEPENDENT_AMBULATORY_CARE_PROVIDER_SITE_OTHER): Payer: 59 | Admitting: Vascular Surgery

## 2017-10-21 ENCOUNTER — Ambulatory Visit (HOSPITAL_COMMUNITY)
Admission: RE | Admit: 2017-10-21 | Discharge: 2017-10-21 | Disposition: A | Discharge status: Home / Self Care | Payer: 59 | Source: Ambulatory Visit | Attending: Vascular Surgery | Admitting: Vascular Surgery

## 2017-10-21 ENCOUNTER — Encounter: Payer: Self-pay | Admitting: Vascular Surgery

## 2017-10-21 VITALS — BP 116/74 | HR 62 | Temp 98.2°F | Resp 18 | Ht 66.0 in | Wt 138.0 lb

## 2017-10-21 DIAGNOSIS — R109 Unspecified abdominal pain: Secondary | ICD-10-CM | POA: Diagnosis not present

## 2017-10-21 DIAGNOSIS — I774 Celiac artery compression syndrome: Secondary | ICD-10-CM

## 2017-10-21 NOTE — Progress Notes (Signed)
Vascular and Vein Specialist of [facility]  Patient name: Jessica Cooper MRN: [id number] DOB: 1961/03/20 Sex: female  REASON FOR CONSULT: Discuss finding of celiac artery compression on recent CT angiogram of her chest  HPI: Jessica Cooper is a 57 y.o. female, who is here for today for discussion of recent finding of celiac artery compression.  She is a very pleasant 57 year old female has several different issues.  She does have abdominal complaints that have dated back to her 59s.  She reports a sensation of bloating that can cause cramping in her abdomen.  This is episodic.  It can happen on approximately a weekly basis for some period of time and then she may go for a year without having any symptoms whatsoever.  This is not changed since her 52s.  She specifically denies any association with eating.  She does not have any pain after eating.  She does not have any weight loss.  She did have a CT scan of her chest with concern regarding pulmonary issues to rule out pulmonary embolus.  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  There was some poststenotic dilatation.  She had a CT scan of her abdomen several months ago to evaluate the chronic bloating that she has.  The compression of the celiac artery was not noted at that time although this was not a dedicated CT angiogram but was CT with contrast.  In retrospect this does show some narrowing.  The superior mesenteric artery and inferior mesenteric artery are widely patent.  Past Medical History:  Diagnosis Date  . Anxiety   . 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 Depression   . Diverticulosis   . 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 Hyperlipidemia    runs around 245 - diet controlled, no meds  . Internal hemorrhoids   . Tubular adenoma of colon      Family History  Problem Relation Age of Onset  . Heart failure Father        heart valve problem   . Hypertension Father   . Breast cancer Sister   . Crohn's disease Sister   . Heart failure Maternal Grandmother   . Colon cancer Neg Hx   . Esophageal cancer Neg Hx   . Rectal cancer Neg Hx   . Stomach cancer Neg Hx     SOCIAL HISTORY: Social History   Socioeconomic History  . Marital status: Married    Spouse name: Not on file  . Number of children: Not on file  . Years of education: Not on file  . Highest education level: Not on file  Occupational History  . Not on file  Social Needs  . Financial resource strain: Not on file  . Food insecurity:    Worry: Not on file    Inability: Not on file  . Transportation needs:    Medical: Not on file    Non-medical: Not on file  Tobacco Use  . Smoking status: Current Every Day Smoker    Packs/day: 1.00    Years: 30.00    Pack years: 30.00    Types: Cigarettes  . Smokeless tobacco: Never Used  Substance and Sexual Activity  . Alcohol use: Yes    Alcohol/week: 4.2 oz    Types: 7 Glasses of wine per week  . Drug use:  No  . Sexual activity: Yes    Birth control/protection: Surgical    Comment: Abd Hysterectomy  Lifestyle  . Physical activity:    Days per week: Not on file    Minutes per session: Not on file  . Stress: Not on file  Relationships  . Social connections:    Talks on phone: Not on file    Gets together: Not on file    Attends religious service: Not on file    Active member of club or organization: Not on file    Attends meetings of clubs or organizations: Not on file    Relationship status: Not on file  . Intimate partner violence:    Fear of current or ex partner: Not on file    Emotionally abused: Not on file    Physically abused: Not on file    Forced sexual activity: Not on file  Other Topics Concern  . Not on file  Social History Narrative   Occupation: Chiropodist Degree    Married   Regular exercise- no   Smoker    HH of 2   5 pets  2 dogs and cats.  One outside cat.    Moved from [place] from job   [id number]    Allergies  Allergen Reactions  . Metronidazole Rash    Current Outpatient Medications  Medication Sig Dispense Refill  . atorvastatin (LIPITOR) 40 MG tablet Take 1 tablet (40 mg total) by mouth daily. 90 tablet 2  . Calcium Citrate (CITRACAL PO) Take by mouth 2 (two) times daily.    . citalopram (CELEXA) 10 MG tablet TAKE 1 TABLET BY MOUTH  DAILY 90 tablet 0  . glucosamine-chondroitin 500-400 MG tablet Take 1 tablet by mouth 3 (three) times daily.      . MULTIPLE VITAMIN PO Take by mouth. Reported on 09/20/2015    . Probiotic Product (ALIGN) 4 MG CAPS Take by mouth.      . Biotin 10 MG TABS Take by mouth. Reported on 09/20/2015    . Cholecalciferol (VITAMIN D3 PO) Take 1,000 Units by mouth daily.     . COD LIVER OIL PO Take by mouth.       No current facility-administered medications for this visit.     REVIEW OF SYSTEMS:  [X]  denotes positive finding, [ ]  denotes negative finding Cardiac  Comments:  Chest pain or chest pressure:    Shortness of breath upon exertion:    Short of breath when lying flat:    Irregular heart rhythm:        Vascular    Pain in calf, thigh, or hip brought on by ambulation:    Pain in feet at night that wakes you up from your sleep:     Blood clot in your veins:    Leg swelling:         Pulmonary    Oxygen at home:    Productive cough:     Wheezing:         Neurologic    Sudden weakness in arms or legs:     Sudden numbness in arms or legs:     Sudden onset of difficulty speaking or slurred speech:    Temporary loss of vision in one eye:     Problems with dizziness:         Gastrointestinal    Blood in stool:     Vomited blood:         Genitourinary  Burning when urinating:     Blood in urine:        Psychiatric    Major depression:         Hematologic    Bleeding problems:    Problems with  blood clotting too easily:        Skin    Rashes or ulcers:        Constitutional    Fever or chills:      PHYSICAL EXAM: Vitals:   10/21/17 0856  BP: 116/74  Pulse: 62  Resp: 18  Temp: 98.2 F (36.8 C)  TempSrc: Oral  SpO2: 100%  Weight: 138 lb (62.6 kg)  Height: 5\' 6"  (1.676 m)    GENERAL: The patient is a well-nourished female, in no acute distress. The vital signs are documented above. CARDIOVASCULAR: 2+ radial and 2+ dorsalis pedis pulses bilaterally PULMONARY: There is good air exchange  ABDOMEN: Soft and non-tender .  I do not hear an abdominal bruit MUSCULOSKELETAL: There are no major deformities or cyanosis. NEUROLOGIC: No focal weakness or paresthesias are detected. SKIN: There are no ulcers or rashes noted. PSYCHIATRIC: The patient has a normal affect.  DATA:  I did review her CT scan images from her chest and abdominal CT scans.  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  She underwent noninvasive studies in our office today.  Her celiac artery velocities were normal with inspiration and expiration with no change  MEDICAL ISSUES: Long discussion with patient regarding her incidental finding.  I do not feel that this is any relationship to her abdominal bloating.  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  I explained that she is not having any symptoms suggestive of mesenteric ischemia and would not recommend any further evaluation or treatment of her incidental finding of celiac compression.  She was reassured with this discussion will see [place] again on an as-needed basis   Rosetta Posner, MD [facility] Vascular and Vein Specialists of [facility] Tel [phone] Pager [phone]

## 2017-10-23 DIAGNOSIS — Z1231 Encounter for screening mammogram for malignant neoplasm of breast: Secondary | ICD-10-CM | POA: Diagnosis not present

## 2017-10-23 LAB — HM MAMMOGRAPHY

## 2017-10-28 ENCOUNTER — Encounter: Payer: Self-pay | Admitting: Internal Medicine

## 2017-10-28 NOTE — Telephone Encounter (Signed)
See other e-mail message - pt no longer needs help with this.  Will close encounter.

## 2017-11-04 ENCOUNTER — Encounter: Payer: Self-pay | Admitting: Internal Medicine

## 2017-11-04 DIAGNOSIS — R928 Other abnormal and inconclusive findings on diagnostic imaging of breast: Secondary | ICD-10-CM | POA: Diagnosis not present

## 2017-11-04 DIAGNOSIS — N6489 Other specified disorders of breast: Secondary | ICD-10-CM | POA: Diagnosis not present

## 2017-11-13 DIAGNOSIS — C44319 Basal cell carcinoma of skin of other parts of face: Secondary | ICD-10-CM | POA: Diagnosis not present

## 2017-11-15 ENCOUNTER — Other Ambulatory Visit: Payer: Self-pay | Admitting: Internal Medicine

## 2017-11-18 NOTE — Telephone Encounter (Signed)
Last seen 09/17/17 Please advise Dr Regis Bill, thanks.

## 2017-11-26 ENCOUNTER — Encounter: Payer: Self-pay | Admitting: Internal Medicine

## 2018-01-05 ENCOUNTER — Other Ambulatory Visit: Payer: Self-pay | Admitting: Internal Medicine

## 2018-01-05 ENCOUNTER — Encounter: Payer: Self-pay | Admitting: Internal Medicine

## 2018-01-28 ENCOUNTER — Other Ambulatory Visit: Payer: Self-pay | Admitting: Internal Medicine

## 2018-01-29 DIAGNOSIS — L821 Other seborrheic keratosis: Secondary | ICD-10-CM | POA: Diagnosis not present

## 2018-01-29 DIAGNOSIS — D485 Neoplasm of uncertain behavior of skin: Secondary | ICD-10-CM | POA: Diagnosis not present

## 2018-01-29 DIAGNOSIS — L570 Actinic keratosis: Secondary | ICD-10-CM | POA: Diagnosis not present

## 2018-01-29 DIAGNOSIS — C44219 Basal cell carcinoma of skin of left ear and external auricular canal: Secondary | ICD-10-CM | POA: Diagnosis not present

## 2018-01-29 DIAGNOSIS — L820 Inflamed seborrheic keratosis: Secondary | ICD-10-CM | POA: Diagnosis not present

## 2018-03-04 DIAGNOSIS — D485 Neoplasm of uncertain behavior of skin: Secondary | ICD-10-CM | POA: Diagnosis not present

## 2018-03-04 DIAGNOSIS — C44219 Basal cell carcinoma of skin of left ear and external auricular canal: Secondary | ICD-10-CM | POA: Diagnosis not present

## 2018-03-25 ENCOUNTER — Other Ambulatory Visit: Payer: Self-pay | Admitting: Internal Medicine

## 2018-04-02 DIAGNOSIS — D2222 Melanocytic nevi of left ear and external auricular canal: Secondary | ICD-10-CM | POA: Diagnosis not present

## 2018-04-02 DIAGNOSIS — C44219 Basal cell carcinoma of skin of left ear and external auricular canal: Secondary | ICD-10-CM | POA: Diagnosis not present

## 2018-04-02 DIAGNOSIS — D485 Neoplasm of uncertain behavior of skin: Secondary | ICD-10-CM | POA: Diagnosis not present

## 2018-05-24 ENCOUNTER — Other Ambulatory Visit: Payer: Self-pay | Admitting: Internal Medicine

## 2018-06-18 ENCOUNTER — Other Ambulatory Visit: Payer: Self-pay | Admitting: Internal Medicine

## 2018-06-23 NOTE — Telephone Encounter (Signed)
Please advise  Last filled: 03/27/18 Last OV:09/17/17

## 2018-07-27 IMAGING — CT CT ABD-PELV W/ CM
2 of 5 series · 16 of 46 positions shown, 18 images · IV contrast (ISOVUE 300)
Comparison: None.

CLINICAL DATA: Right-sided abdominal pain for 9 months. Chronic
diarrhea. History of diverticulosis and colonic polyps.

EXAM:
CT ABDOMEN AND PELVIS WITH CONTRAST
TECHNIQUE: Multidetector CT imaging of the abdomen and pelvis was performed
using the standard protocol following bolus administration of
intravenous contrast.
CONTRAST:  100 mL [id number]

[Series 2: abd/pel w · axial · 0.69mm/px · z∈[-467,-87]mm · 13 of 86 slices shown, 15 images]
[im 5/86  soft-tissue]
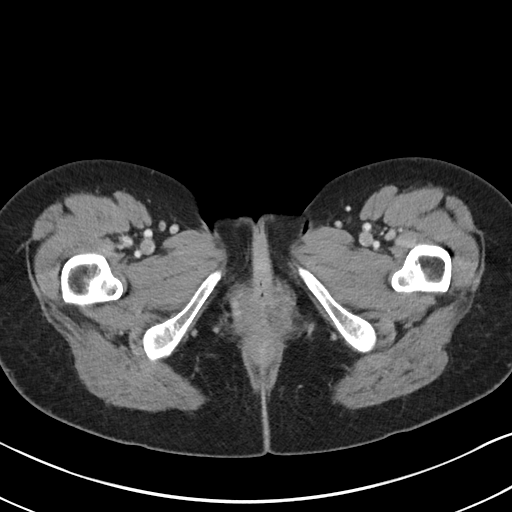
[im 5/86  bone]
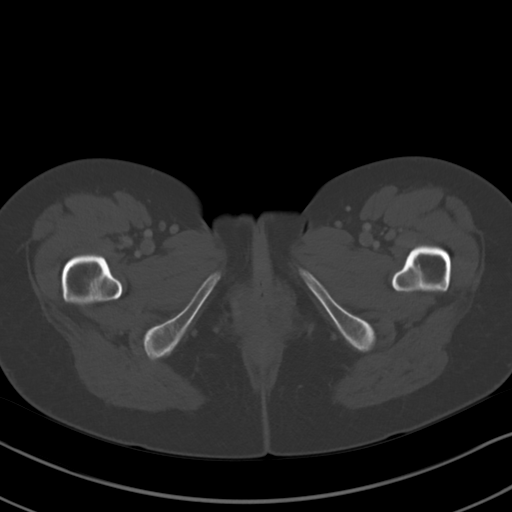
[im 14/86  soft-tissue]
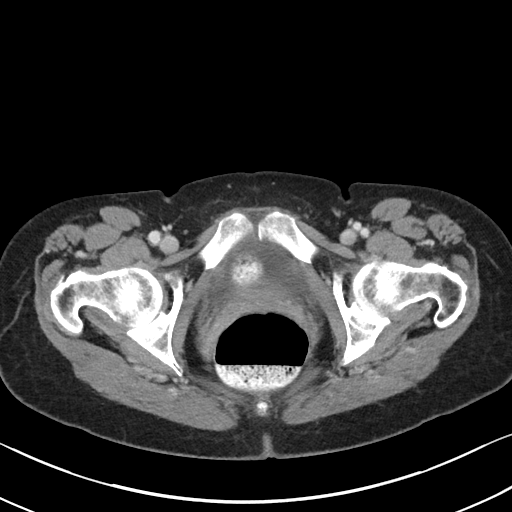
[im 18/86  soft-tissue]
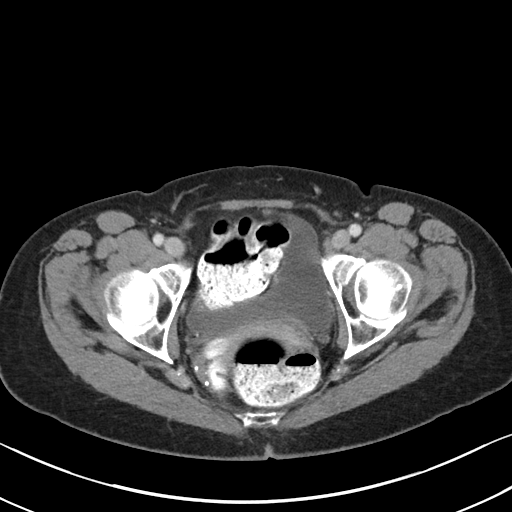
[im 23/86  soft-tissue]
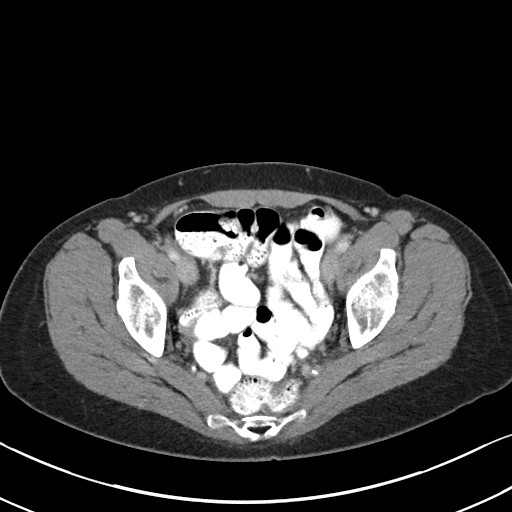
[im 32/86  soft-tissue]
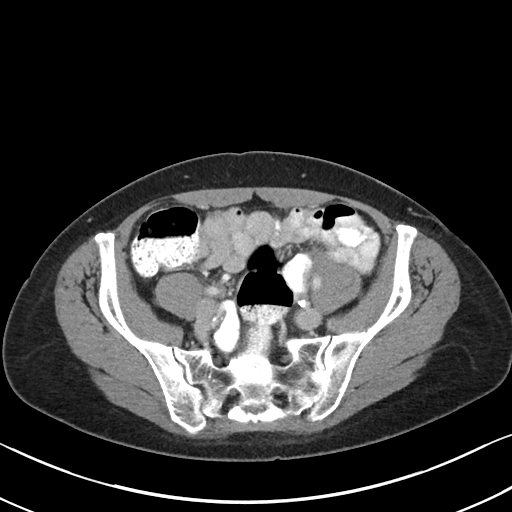
[im 36/86  soft-tissue]
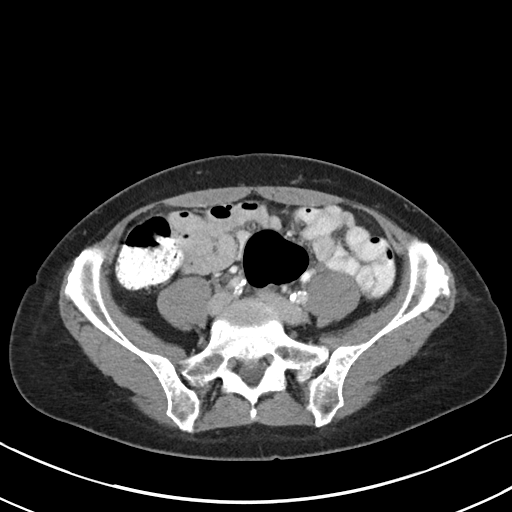
[im 45/86  soft-tissue]
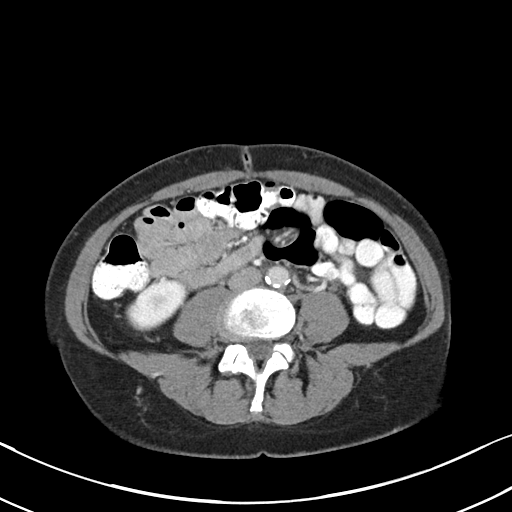
[im 50/86  soft-tissue]
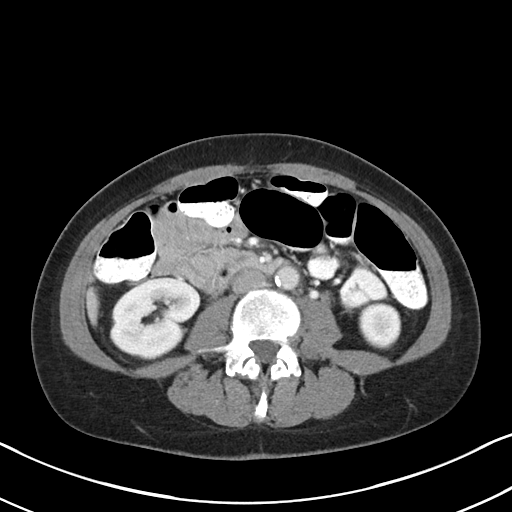
[im 54/86  soft-tissue]
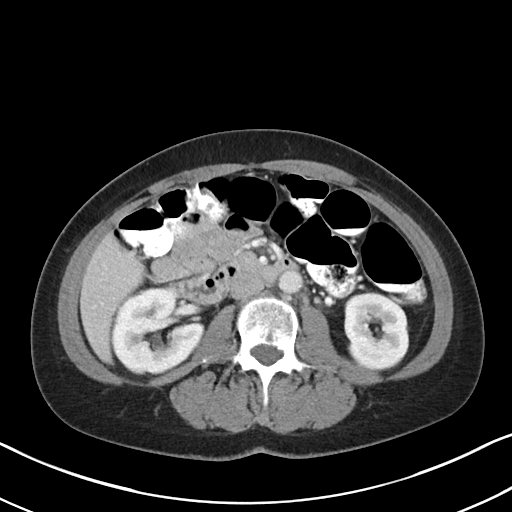
[im 54/86  bone]
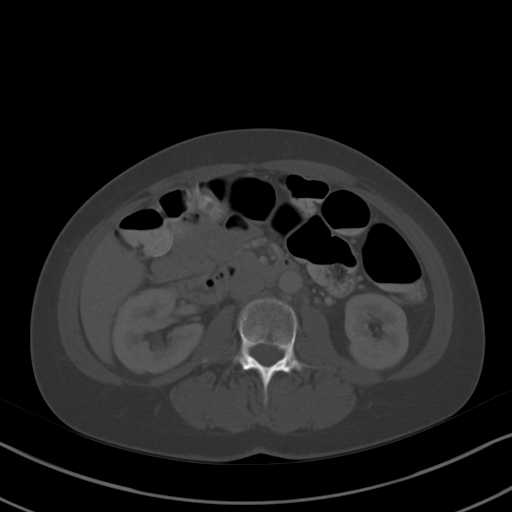
[im 63/86  soft-tissue]
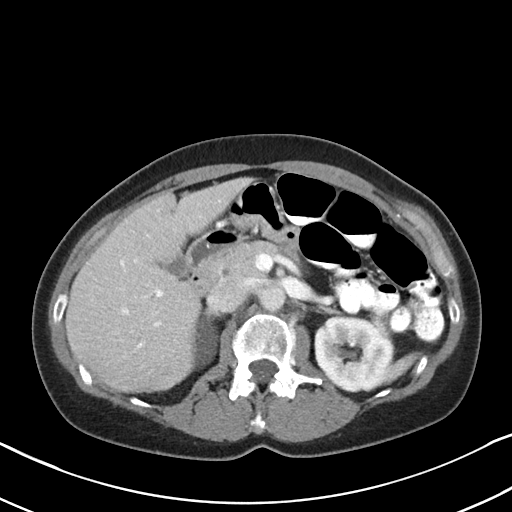
[im 68/86  soft-tissue]
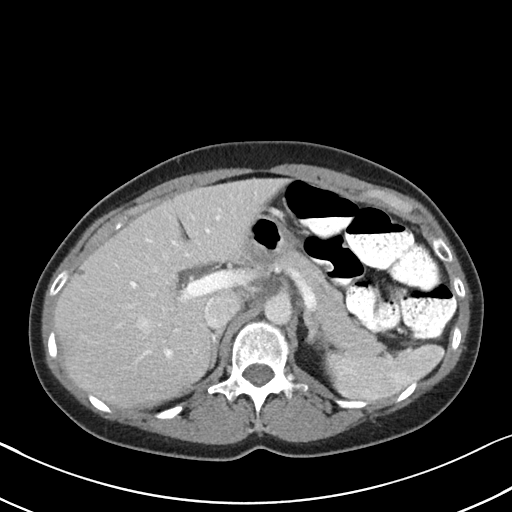
[im 72/86  soft-tissue]
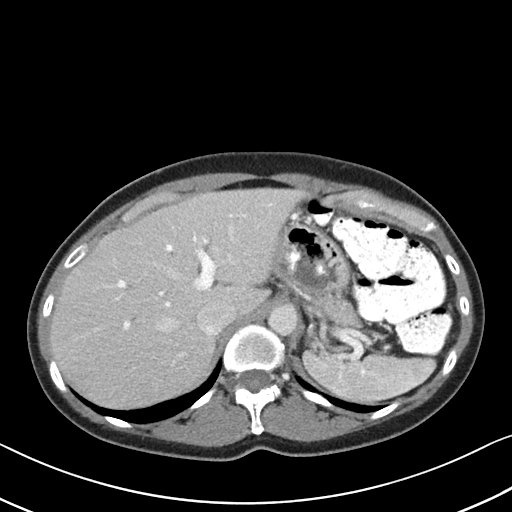
[im 81/86  soft-tissue]
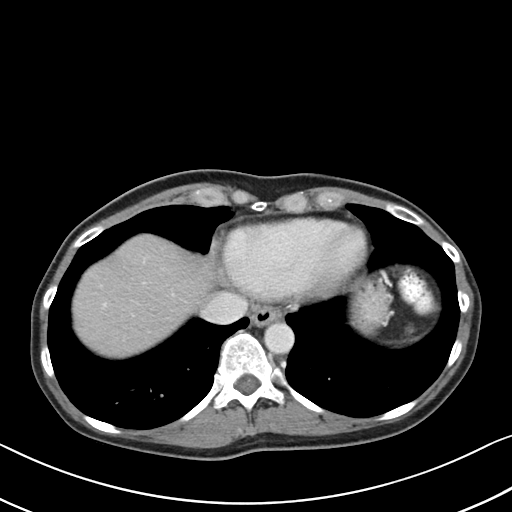

[Series 6: abd/pel w st · coronal · 0.65mm/px · 3 of 69 slices shown]
[im 23/69  soft-tissue]
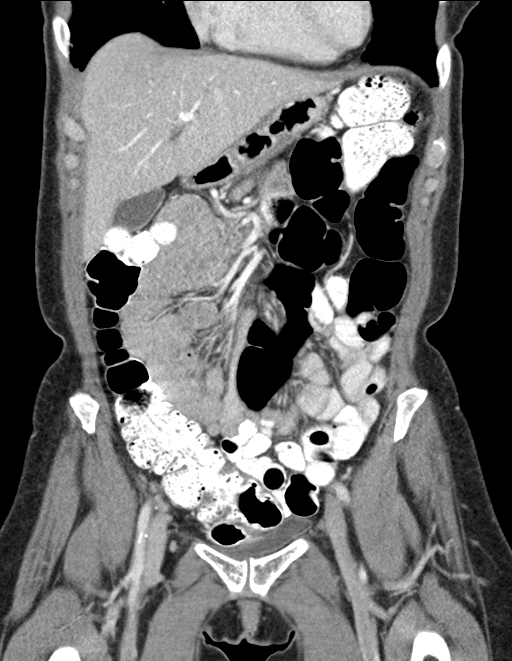
[im 31/69  soft-tissue]
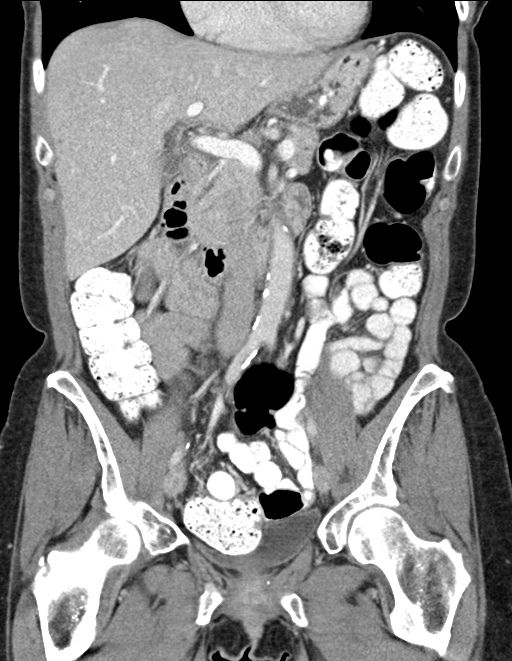
[im 38/69  soft-tissue]
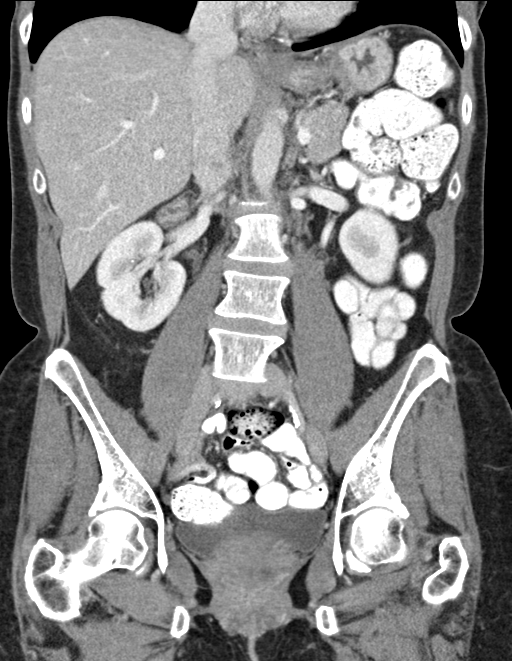

[16 of 46 positions shown; findings below may reference images not displayed]

FINDINGS: Lower Chest: No acute findings.

Hepatobiliary: No hepatic masses identified. A few tiny sub-cm
hepatic cysts are noted. Focal fatty infiltration noted adjacent to
the falciform ligament. Gallbladder is unremarkable.

Pancreas:  No mass or inflammatory changes.

Spleen: Within normal limits in size and appearance.

Adrenals/Urinary Tract: No masses identified. Simple cyst noted in
upper pole of right kidney. A few tiny 1-2 mm nonobstructing calculi
are seen in the upper and mid poles of the right kidney. No evidence
of ureteral calculi or hydronephrosis.

Stomach/Bowel: No evidence of obstruction, inflammatory process or
abnormal fluid collections. Partial midgut malrotation noted with
proximal small bowel loops in the right abdomen. Normal position of
cecum noted in right pelvis. Normal appendix visualized.

Vascular/Lymphatic: No pathologically enlarged lymph nodes. No
abdominal aortic aneurysm. Aortic atherosclerosis.

Reproductive: Prior hysterectomy noted. Adnexal regions are
unremarkable in appearance.

Other:  None.

Musculoskeletal:  No suspicious bone lesions identified.
IMPRESSION: No acute findings.

Tiny nonobstructing right renal calculi.

Congenital partial midgut malrotation. No evidence of volvulus or
obstruction.

## 2018-08-11 DIAGNOSIS — L578 Other skin changes due to chronic exposure to nonionizing radiation: Secondary | ICD-10-CM | POA: Diagnosis not present

## 2018-08-11 DIAGNOSIS — C44219 Basal cell carcinoma of skin of left ear and external auricular canal: Secondary | ICD-10-CM | POA: Diagnosis not present

## 2018-08-11 DIAGNOSIS — C44319 Basal cell carcinoma of skin of other parts of face: Secondary | ICD-10-CM | POA: Diagnosis not present

## 2018-08-17 ENCOUNTER — Other Ambulatory Visit: Payer: Self-pay | Admitting: Internal Medicine

## 2018-09-01 ENCOUNTER — Encounter: Payer: Self-pay | Admitting: Internal Medicine

## 2018-09-11 ENCOUNTER — Ambulatory Visit: Payer: 59 | Admitting: Internal Medicine

## 2018-09-16 ENCOUNTER — Encounter: Payer: Self-pay | Admitting: *Deleted

## 2018-09-16 NOTE — Telephone Encounter (Signed)
Erroneous encounter

## 2018-09-17 ENCOUNTER — Ambulatory Visit (AMBULATORY_SURGERY_CENTER): Payer: Self-pay

## 2018-09-17 ENCOUNTER — Other Ambulatory Visit: Payer: Self-pay

## 2018-09-17 VITALS — Ht 65.5 in | Wt 139.0 lb

## 2018-09-17 DIAGNOSIS — Z86010 Personal history of colonic polyps: Secondary | ICD-10-CM

## 2018-09-17 MED ORDER — NA SULFATE-K SULFATE-MG SULF 17.5-3.13-1.6 GM/177ML PO SOLN: 1.0000 | Freq: Once | ORAL | 0 refills | Status: AC

## 2018-09-17 NOTE — Progress Notes (Signed)
Denies allergies to eggs or soy products. Denies complication of anesthesia or sedation. Denies use of weight loss medication. Denies use of O2.   Emmi instructions declined.   PV was conducted by phone due to Covid 19. Instructions and a 15.00 coupon for Suprep were mailed to [address] [place], [place]  [address]. [facility] was verified by patient. Patient was instructed to call us if she has any questions or concerns.

## 2018-09-23 ENCOUNTER — Telehealth: Payer: Self-pay

## 2018-09-23 ENCOUNTER — Other Ambulatory Visit: Payer: Self-pay | Admitting: Internal Medicine

## 2018-09-23 NOTE — Telephone Encounter (Signed)
Lvm for pt to call back. Pt needs a virtual visit to be able to get refill on citalopram. Crm created

## 2018-09-23 NOTE — Telephone Encounter (Signed)
See phone note for 09/23/2018 pt needs virtual visit for phone note

## 2018-09-24 ENCOUNTER — Encounter: Payer: 59 | Admitting: Internal Medicine

## 2018-10-22 ENCOUNTER — Encounter: Payer: 59 | Admitting: Internal Medicine

## 2018-10-27 ENCOUNTER — Telehealth: Payer: Self-pay | Admitting: *Deleted

## 2018-10-27 DIAGNOSIS — Z86010 Personal history of colonic polyps: Secondary | ICD-10-CM

## 2018-10-27 MED ORDER — NA SULFATE-K SULFATE-MG SULF 17.5-3.13-1.6 GM/177ML PO SOLN: ORAL | 0 refills | Status: DC

## 2018-10-27 NOTE — Telephone Encounter (Signed)
Suprep rx sent to pt's pharmacy.  New instructions sent to pt via MyChart

## 2018-10-27 NOTE — Telephone Encounter (Signed)
Called pt to reschedule postponed procedure.  Rescheduled for 5/19 @ 1:00.  Will resend prep RX to [facility] on [address] in [place] and send new prep instructions via My Chart.

## 2018-11-08 ENCOUNTER — Telehealth: Payer: Self-pay | Admitting: *Deleted

## 2018-11-08 NOTE — Telephone Encounter (Signed)
Covid-19 travel screening questions  Have you traveled in the last 14 days? no If yes where?  Do you now or have you had a fever in the last 14 days? no  Do you have any respiratory symptoms of shortness of breath or cough now or in the last 14 days? no  Do you have any family members or close contacts with diagnosed or suspected Covid-19? No  Pt is aware that care partner will be waiting in the car during the procedure.  She will wear a mask into the building

## 2018-11-10 ENCOUNTER — Other Ambulatory Visit: Payer: Self-pay | Admitting: Internal Medicine

## 2018-11-10 ENCOUNTER — Encounter: Payer: Self-pay | Admitting: Internal Medicine

## 2018-11-10 ENCOUNTER — Ambulatory Visit (AMBULATORY_SURGERY_CENTER): Payer: 59 | Admitting: Internal Medicine

## 2018-11-10 ENCOUNTER — Other Ambulatory Visit: Payer: Self-pay

## 2018-11-10 VITALS — BP 116/63 | HR 51 | Temp 99.1°F | Resp 17 | Ht 65.5 in | Wt 139.0 lb

## 2018-11-10 DIAGNOSIS — Z86010 Personal history of colonic polyps: Secondary | ICD-10-CM

## 2018-11-10 DIAGNOSIS — D124 Benign neoplasm of descending colon: Secondary | ICD-10-CM

## 2018-11-10 DIAGNOSIS — K635 Polyp of colon: Secondary | ICD-10-CM | POA: Diagnosis not present

## 2018-11-10 DIAGNOSIS — D123 Benign neoplasm of transverse colon: Secondary | ICD-10-CM | POA: Diagnosis not present

## 2018-11-10 MED ORDER — SODIUM CHLORIDE 0.9 % IV SOLN: 500.0000 mL | Freq: Once | INTRAVENOUS | Status: DC

## 2018-11-10 NOTE — Progress Notes (Signed)
Called to room to assist during endoscopic procedure.  Patient ID and intended procedure confirmed with present staff. Received instructions for my participation in the procedure from the performing physician.  

## 2018-11-10 NOTE — Progress Notes (Signed)
A/ox3, pleased with MAC, report to RN 

## 2018-11-10 NOTE — Op Note (Signed)
[place] Patient Name: Jessica Cooper Procedure Date: 11/10/2018 1:14 PM MRN: [id number] Endoscopist: Jerene Bears , MD Age: 58 Referring MD:  Date of Birth: [birth year] Gender: Female Account #: [id number] Procedure:                Colonoscopy Indications:              High risk colon cancer surveillance: Personal                            history of multiple (3 or more) adenomas, Last                            colonoscopy 3 years ago Medicines:                Monitored Anesthesia Care Procedure:                Pre-Anesthesia Assessment:                           - Prior to the procedure, a History and Physical                            was performed, and patient medications and                            allergies were reviewed. The patient's tolerance of                            previous anesthesia was also reviewed. The risks                            and benefits of the procedure and the sedation                            options and risks were discussed with the patient.                            All questions were answered, and informed consent                            was obtained. Prior Anticoagulants: The patient has                            taken no previous anticoagulant or antiplatelet                            agents. ASA Grade Assessment: II - A patient with                            mild systemic disease. After reviewing the risks                            and benefits, the patient was deemed in  satisfactory condition to undergo the procedure.                           After obtaining informed consent, the colonoscope                            was passed under direct vision. Throughout the                            procedure, the patient's blood pressure, pulse, and                            oxygen saturations were monitored continuously. The                            Colonoscope was introduced through the anus  and                            advanced to the cecum, identified by appendiceal                            orifice and ileocecal valve. The colonoscopy was                            performed without difficulty. The patient tolerated                            the procedure well. The quality of the bowel                            preparation was good. The ileocecal valve,                            appendiceal orifice, and rectum were photographed. Scope In: 1:29:49 PM Scope Out: 1:51:54 PM Scope Withdrawal Time: 0 hours 17 minutes 42 seconds  Total Procedure Duration: 0 hours 22 minutes 5 seconds  Findings:                 The digital rectal exam was normal.                           Two sessile polyps were found in the transverse                            colon. The polyps were 3 to 5 mm in size. These                            polyps were removed with a cold snare. Resection                            and retrieval were complete.                           A 4 mm polyp was found in the descending  colon. The                            polyp was sessile. The polyp was removed with a                            cold snare. Resection and retrieval were complete.                           A few small-mouthed diverticula were found in the                            sigmoid colon.                           Internal hemorrhoids were found during                            retroflexion. The hemorrhoids were small. Complications:            No immediate complications. Estimated Blood Loss:     Estimated blood loss was minimal. Impression:               - Two 3 to 5 mm polyps in the transverse colon,                            removed with a cold snare. Resected and retrieved.                           - One 4 mm polyp in the descending colon, removed                            with a cold snare. Resected and retrieved.                           - Diverticulosis in the sigmoid colon.                            - Very small internal hemorrhoids. Recommendation:           - Patient has a contact number available for                            emergencies. The signs and symptoms of potential                            delayed complications were discussed with the                            patient. Return to normal activities tomorrow.                            Written discharge instructions were provided to the  patient.                           - Resume previous diet.                           - Continue present medications.                           - Await pathology results.                           - Repeat colonoscopy is recommended for                            surveillance. The colonoscopy date will be                            determined after pathology results from today's                            exam become available for review. Jerene Bears, MD 11/10/2018 1:57:25 PM This report has been signed electronically.

## 2018-11-10 NOTE — Patient Instructions (Signed)
Information on polyps, diverticulosis and hemorrhoids given to you today.  Await pathology results.  YOU HAD AN ENDOSCOPIC PROCEDURE TODAY AT [facility]:   Refer to the procedure report that was given to you for any specific questions about what was found during the examination.  If the procedure report does not answer your questions, please call your gastroenterologist to clarify.  If you requested that your care partner not be given the details of your procedure findings, then the procedure report has been included in a sealed envelope for you to review at your convenience later.  YOU SHOULD EXPECT: Some feelings of bloating in the abdomen. Passage of more gas than usual.  Walking can help get rid of the air that was put into your GI tract during the procedure and reduce the bloating. If you had a lower endoscopy (such as a colonoscopy or flexible sigmoidoscopy) you may notice spotting of blood in your stool or on the toilet paper. If you underwent a bowel prep for your procedure, you may not have a normal bowel movement for a few days.  Please Note:  You might notice some irritation and congestion in your nose or some drainage.  This is from the oxygen used during your procedure.  There is no need for concern and it should clear up in a day or so.  SYMPTOMS TO REPORT IMMEDIATELY:   Following lower endoscopy (colonoscopy or flexible sigmoidoscopy):  Excessive amounts of blood in the stool  Significant tenderness or worsening of abdominal pains  Swelling of the abdomen that is new, acute  Fever of 100F or higher   For urgent or emergent issues, a gastroenterologist can be reached at any hour by calling [phone].   DIET:  We do recommend a small meal at first, but then you may proceed to your regular diet.  Drink plenty of fluids but you should avoid alcoholic beverages for 24 hours.  ACTIVITY:  You should plan to take it easy for the rest of today and you should NOT  DRIVE or use heavy machinery until tomorrow (because of the sedation medicines used during the test).    FOLLOW UP: Our staff will call the number listed on your records 48-72 hours following your procedure to check on you and address any questions or concerns that you may have regarding the information given to you following your procedure. If we do not reach you, we will leave a message.  We will attempt to reach you two times.  During this call, we will ask if you have developed any symptoms of COVID 19. If you develop any symptoms (for example fever, flu-like symptoms, shortness of breath, cough etc.) before then, please call [phone].  If any biopsies were taken you will be contacted by phone or by letter within the next 1-3 weeks.  Please call us at [phone] if you have not heard about the biopsies in 3 weeks.    SIGNATURES/CONFIDENTIALITY: You and/or your care partner have signed paperwork which will be entered into your electronic medical record.  These signatures attest to the fact that that the information above on your After Visit Summary has been reviewed and is understood.  Full responsibility of the confidentiality of this discharge information lies with you and/or your care-partner.

## 2018-11-10 NOTE — Progress Notes (Signed)
Riki Sheer, LPN - temp/VS

## 2018-11-12 ENCOUNTER — Telehealth: Payer: Self-pay

## 2018-11-12 NOTE — Telephone Encounter (Signed)
  Follow up Call-  Call back number 11/10/2018  Post procedure Call Back phone  # [phone] cell  Permission to leave phone message Yes  Some recent data might be hidden     Patient questions:  Do you have a fever, pain , or abdominal swelling? No. Pain Score  0 *  Have you tolerated food without any problems? Yes.    Have you been able to return to your normal activities? Yes.    Do you have any questions about your discharge instructions: Diet   No. Medications  No. Follow up visit  No.  Do you have questions or concerns about your Care? No.  Actions: * If pain score is 4 or above: No action needed, pain <4.  Covid-19 travel screening questions  Have you traveled in the last 14 days?no If yes where?  Do you now or have you had a fever in the last 14 days?no  Do you have any respiratory symptoms of shortness of breath or cough now or in the last 14 days?no  Do you have a medical history of Congestive Heart Failure?  Do you have a medical history of lung disease?  Do you have any family members or close contacts with diagnosed or suspected Covid-19?no

## 2018-11-17 ENCOUNTER — Encounter: Payer: Self-pay | Admitting: Internal Medicine

## 2018-11-17 NOTE — Telephone Encounter (Signed)
Please let patient know that I appreciate her concern In light of her opinion, please place recall for 3 years with an office visit first We will discuss the risk, benefits and alternatives of repeating colonoscopy at that appointment in 3 years

## 2018-11-23 LAB — HM MAMMOGRAPHY

## 2018-11-25 ENCOUNTER — Encounter: Payer: Self-pay | Admitting: Internal Medicine

## 2018-12-15 ENCOUNTER — Other Ambulatory Visit: Payer: Self-pay

## 2019-01-13 ENCOUNTER — Other Ambulatory Visit: Payer: Self-pay

## 2019-01-13 ENCOUNTER — Encounter: Payer: Self-pay | Admitting: Internal Medicine

## 2019-01-13 ENCOUNTER — Ambulatory Visit (INDEPENDENT_AMBULATORY_CARE_PROVIDER_SITE_OTHER): Payer: 59 | Admitting: Internal Medicine

## 2019-01-13 DIAGNOSIS — F17200 Nicotine dependence, unspecified, uncomplicated: Secondary | ICD-10-CM | POA: Diagnosis not present

## 2019-01-13 DIAGNOSIS — R042 Hemoptysis: Secondary | ICD-10-CM | POA: Diagnosis not present

## 2019-01-13 DIAGNOSIS — R070 Pain in throat: Secondary | ICD-10-CM | POA: Diagnosis not present

## 2019-01-13 NOTE — Progress Notes (Signed)
Virtual Visit via Video Note  I connected with@ on 01/13/19 at  3:00 PM EDT by a video enabled telemedicine application and verified that I am speaking with the correct person using two identifiers. Location patient: home Location provider:work  office Persons participating in the virtual visit: patient, provider  [place] national recommendations  regarding COVID 19 pandemic   video visit is advised over in office visit for this patient.  Patient aware  of the limitations of evaluation and management by telemedicine and  availability of in person appointments. and agreed to proceed.   HPI:  Jessica Cooper presents for video visit because of  Right throat pain tenderness for over a month. No fever  Weight loss  dysphagia  Tenderness and deep right side of throat jaw area.    Is a smoker   No dental pain sometimes  Salivary pain bilateral.    Has had 3 episode soft blood in mucous  Once throat clearing ain am  The last time blood on her pillow   And mucus   She has a baseline cough at times from  Her tobacco but no change  recent had right neck pain and numbness down to index finger And better pain but finger still numb  ROS: See pertinent positives and negatives per HPI. No sweats   No lesion noted .  Recently job  Loss  [id number]     Past Medical History:  Diagnosis Date  . Anxiety   . Cancer ([place])   . 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 Depression   . Diverticulosis   . 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 Hyperlipidemia    runs around 245 - diet controlled, no meds  . Internal hemorrhoids   . Neuromuscular disorder ([place])    partial malrotation of mid gut  . Osteopenia   . Thyroid disease    Hyperparathyroidism  . Tubular adenoma of colon     Past Surgical History:  Procedure Laterality Date  . ABDOMINAL HYSTERECTOMY  08/2008   fibroid bleeding  . BREAST BIOPSY Right    Aug 2015-"tracker' placed for hyperplasia cells  .  CERVICAL CONE BIOPSY    . COLONOSCOPY    . IR RADIOLOGIST EVAL & MGMT  02/04/2017  . MOHS SURGERY    . PARATHYROIDECTOMY Bilateral   . POLYPECTOMY    . WISDOM TOOTH EXTRACTION      Family History  Problem Relation Age of Onset  . Heart failure Father        heart valve problem   . Hypertension Father   . Breast cancer Sister   . Crohn's disease Sister   . Heart failure Maternal Grandmother   . Colon cancer Neg Hx   . Esophageal cancer Neg Hx   . Rectal cancer Neg Hx   . Stomach cancer Neg Hx     Social History   Tobacco Use  . Smoking status: Current Every Day Smoker    Packs/day: 1.00    Years: 30.00    Pack years: 30.00    Types: Cigarettes  . Smokeless tobacco: Never Used  Substance Use Topics  . Alcohol use: Yes    Alcohol/week: 7.0 standard drinks    Types: 7 Glasses of wine per week  . Drug use: No      Current Outpatient Medications:  .  atorvastatin (LIPITOR) 40 MG tablet, TAKE 1 TABLET BY MOUTH  DAILY, Disp: 90 tablet,  Rfl: 0 .  Calcium Citrate (CITRACAL PO), Take by mouth 2 (two) times daily., Disp: , Rfl:  .  citalopram (CELEXA) 10 MG tablet, Take 1 tablet (10 mg total) by mouth daily. Please call office for   webx appt before future refills ., Disp: 90 tablet, Rfl: 0 .  glucosamine-chondroitin 500-400 MG tablet, Take 1 tablet by mouth 3 (three) times daily.  , Disp: , Rfl:  .  MULTIPLE VITAMIN PO, Take by mouth. Reported on 09/20/2015, Disp: , Rfl:  .  Probiotic Product (ALIGN) 4 MG CAPS, Take by mouth.  , Disp: , Rfl:   EXAM: BP Readings from Last 3 Encounters:  11/10/18 116/63  10/21/17 116/74  09/17/17 108/72    VITALS per patient if applicable:  GENERAL: alert, oriented, appears well and in no acute distress HEENT: atraumatic, conjunttiva clear, no obvious abnormalities on inspection of external nose and ears NECK: normal movements of the head and neck points to right upper lateral throat area but no mass  LUNGS: on inspection no signs of  respiratory distress, breathing rate appears normal, no obvious gross SOB, gasping or wheezing CV: no obvious cyanosislity PSYCH/NEURO: pleasant and cooperative, no obvious depression or anxiety, speech and thought processing grossly intact Lab Results  Component Value Date   WBC 7.2 03/20/2017   HGB 14.8 03/20/2017   HCT 43.4 03/20/2017   PLT 217.0 03/20/2017   GLUCOSE CANCELED 04/21/2017   CHOL 206 (H) 05/27/2017   TRIG 75.0 05/27/2017   HDL 73.20 05/27/2017   LDLDIRECT 178.9 09/18/2012   LDLCALC 117 (H) 05/27/2017   ALT 27 03/20/2017   AST 22 03/20/2017   NA 140 04/21/2017   K 5.1 04/21/2017   CL 104 04/21/2017   CREATININE 0.85 04/21/2017   BUN 11 04/21/2017   CO2 20 04/21/2017   TSH 1.31 12/14/2015   INR 0.90 02/07/2017   HGBA1C 5.1 12/06/2014    ASSESSMENT AND PLAN:  Discussed the following assessment and plan:    ICD-10-CM   1. Throat pain in adult  R07.0   2. Hemoptysis sounds like ent blood   R04.2    smoker    3. Smoker  F17.200     month of sx and  ocass  Upper  resp  Blood   R/o lesion    Good ent  Exam and  evaluation advised   Counseled.   Expectant management and discussion of plan and treatment with opportunity to ask questions and all were answered. The patient agreed with the plan and demonstrated an understanding of the instructions.   Advised to call back or seek an in-person evaluation if worsening  or having  further concerns .   Shanon Ace, MD

## 2019-01-20 ENCOUNTER — Other Ambulatory Visit: Payer: Self-pay | Admitting: Otolaryngology

## 2019-02-03 ENCOUNTER — Other Ambulatory Visit: Payer: Self-pay | Admitting: Internal Medicine

## 2019-04-20 ENCOUNTER — Telehealth: Payer: 59 | Admitting: Physician Assistant

## 2019-04-20 DIAGNOSIS — R21 Rash and other nonspecific skin eruption: Secondary | ICD-10-CM

## 2019-04-20 MED ORDER — GABAPENTIN 300 MG PO CAPS: 300.0000 mg | ORAL_CAPSULE | Freq: Two times a day (BID) | ORAL | 0 refills | Status: DC | PRN

## 2019-04-20 MED ORDER — VALACYCLOVIR HCL 1 G PO TABS: 1000.0000 mg | ORAL_TABLET | Freq: Three times a day (TID) | ORAL | 0 refills | Status: AC

## 2019-04-20 NOTE — Telephone Encounter (Signed)
So there is no "48 hour shot"  to get in her  Clinical situation .  Sound like you may be having  a local reaction .   There are meds to take for shingles outbreaks .  You can send in a picture    (FYI The e visit was not  A Chief Operating Officer.  In the future you may want to ask for a e visit or virtual visit  With me or in our primary care practice .)

## 2019-04-20 NOTE — Progress Notes (Signed)
E-visit for Shingles   We are sorry that you are not feeling well. Here is how we plan to help!  Based on what you shared with me it seems like you may have rshingles.  Shingles or herpes zoster, is a common infection of the nerves.  It is a painful rash caused by the herpes zoster virus.  This is the same virus that causes chickenpox.  After a person has chickenpox, the virus remains inactive in the nerve cells.  Years later, the virus can become active again and travel to the skin.  It typically will appear on one side of the face or body.  Burning or shooting pain, tingling, or itching are early signs of the infection.  Blisters typically scab over in 7 to 10 days and clear up within 2-4 weeks. Shingles is only contagious to people that have never had the chickenpox, the chickenpox vaccine, or anyone who has a compromised immune system.  You should avoid contact with these type of people until your blisters scab over.  I have prescribed Valacyclovir 1g three times daily for 7 days and also Gabapentin 300mg  twice daily as needed for pain   HOME CARE: . Apply ice packs (wrapped in a thin towel), cool compresses, or soak in cool bath to help reduce pain. . Use calamine lotion to calm itchy skin. . Avoid scratching the rash. . Avoid direct sunlight.  GET HELP RIGHT AWAY IF: . Symptoms that don't away after treatment. . A rash or blisters near your eye. . Increased drainage, fever, or rash after treatment. . Severe pain that doesn't go away.   MAKE SURE YOU    Understand these instructions.  Will watch your condition.  Will get help right away if you are not doing well or get worse.  Thank you for choosing an e-visit. Your e-visit answers were reviewed by a board certified advanced clinical practitioner to complete your personal care plan. Depending upon the condition, your plan could have included both over the counter or prescription medications.  Please review your pharmacy  choice. Make sure the pharmacy is open so you can pick up prescription now. If there is a problem, you may contact your provider through [employer] and have the prescription routed to another pharmacy.  Your safety is important to [place]. If you have drug allergies check your prescription carefully.   For the next 24 hours you can use MyChart to ask questions about today's visit, request a non-urgent call back, or ask for a work or school excuse.  You will get an email in the next two days asking about your experience. I hope that your e-visit has been valuable and will speed your recovery  Approximately 5 minutes was spent documenting and reviewing patient's chart.

## 2019-05-14 ENCOUNTER — Other Ambulatory Visit: Payer: Self-pay | Admitting: Internal Medicine

## 2019-10-04 NOTE — Progress Notes (Signed)
Virtual Visit via Video Note  I connected with@ on 10/05/19 at  2:30 PM EDT by a video enabled telemedicine application and verified that I am speaking with the correct person using two identifiers. Location patient: home Location provider:work or home office Persons participating in the virtual visit: patient, provider  [place] national recommendations  regarding COVID 19 pandemic   video visit is advised over in office visit for this patient.  Patient aware  of the limitations of evaluation and management by telemedicine and  availability of in person appointments. and agreed to proceed.   HPI: Jessica Cooper presents for video visit for  On going  lesion on bottom of  r foot  Over recent times  Without sx   At home  Lives with husband no t out  Does wear shoes without socks but no exposures otherwise  and no pain itching  Weeping and no new shoes   Looked up opix on line and seems like mosaic plantar warts on midlat foot and heel area . Pix sent  ROS: See pertinent positives and negatives per HPI. Still wo job work from home  Past Medical History:  Diagnosis Date  . Anxiety   . Cancer ([place])   . 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 Depression   . Diverticulosis   . 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 Hyperlipidemia    runs around 245 - diet controlled, no meds  . Internal hemorrhoids   . Neuromuscular disorder ([place])    partial malrotation of mid gut  . Osteopenia   . Thyroid disease    Hyperparathyroidism  . Tubular adenoma of colon     Family History  Problem Relation Age of Onset  . Heart failure Father        heart valve problem   . Hypertension Father   . Breast cancer Sister   . Crohn's disease Sister   . Heart failure Maternal Grandmother   . Colon cancer Neg Hx   . Esophageal cancer Neg Hx   . Rectal cancer Neg Hx   . Stomach cancer Neg Hx     Social History   Socioeconomic History  . Marital status: Married     Spouse name: Not on file  . Number of children: Not on file  . Years of education: Not on file  . Highest education level: Not on file  Occupational History  . Not on file  Tobacco Use  . Smoking status: Current Every Day Smoker    Packs/day: 1.00    Years: 30.00    Pack years: 30.00    Types: Cigarettes  . Smokeless tobacco: Never Used  Substance and Sexual Activity  . Alcohol use: Yes    Alcohol/week: 7.0 standard drinks    Types: 7 Glasses of wine per week  . Drug use: No  . Sexual activity: Yes    Birth control/protection: Surgical    Comment: Abd Hysterectomy  Other Topics Concern  . Not on file  Social History Narrative   Occupation: Chiropodist Degree   Married   Regular exercise- no   Smoker    HH of 2   5 pets  2 dogs and cats.  One outside cat.    Moved from [place] from job   G0P0   Social Determinants of Health   Financial Resource Strain:   . Difficulty of Paying Living Expenses:   Food Insecurity:   .  Worried About Charity fundraiser in the Last Year:   . Arboriculturist in the Last Year:   Transportation Needs:   . Film/video editor (Medical):   Marland Kitchen Lack of Transportation (Non-Medical):   Physical Activity:   . Days of Exercise per Week:   . Minutes of Exercise per Session:   Stress:   . Feeling of Stress :   Social Connections:   . Frequency of Communication with Friends and Family:   . Frequency of Social Gatherings with Friends and Family:   . Attends Religious Services:   . Active Member of Clubs or Organizations:   . Attends Archivist Meetings:   Marland Kitchen Marital Status:     Outpatient Medications Prior to Visit  Medication Sig Dispense Refill  . atorvastatin (LIPITOR) 40 MG tablet TAKE 1 TABLET BY MOUTH  DAILY 90 tablet 3  . Calcium Citrate (CITRACAL PO) Take by mouth 2 (two) times daily.    . citalopram (CELEXA) 10 MG tablet TAKE 1 TABLET BY MOUTH  DAILY 90 tablet 1  . glucosamine-chondroitin 500-400 MG tablet  Take 1 tablet by mouth 3 (three) times daily.      . MULTIPLE VITAMIN PO Take by mouth. Reported on 09/20/2015    . Probiotic Product (ALIGN) 4 MG CAPS Take by mouth.      . gabapentin (NEURONTIN) 300 MG capsule Take 1 capsule (300 mg total) by mouth 2 (two) times daily as needed for up to 7 days. 14 capsule 0   No facility-administered medications prior to visit.     EXAM:  TThere is no height or weight on file to calculate BMI. Looks well   Foot in pix and in person  With reddened patchyg flaky callous  Area lateral foot  And at heel   GENERAL: vitals reviewed and listed above, alert, oriented, appears well hydrated and in no acute distress PSYCH: pleasant and cooperative, no obvious depression or anxiety Lab Results  Component Value Date   WBC 7.2 03/20/2017   HGB 14.8 03/20/2017   HCT 43.4 03/20/2017   PLT 217.0 03/20/2017   GLUCOSE CANCELED 04/21/2017   CHOL 206 (H) 05/27/2017   TRIG 75.0 05/27/2017   HDL 73.20 05/27/2017   LDLDIRECT 178.9 09/18/2012   LDLCALC 117 (H) 05/27/2017   ALT 27 03/20/2017   AST 22 03/20/2017   NA 140 04/21/2017   K 5.1 04/21/2017   CL 104 04/21/2017   CREATININE 0.85 04/21/2017   BUN 11 04/21/2017   CO2 20 04/21/2017   TSH 1.31 12/14/2015   INR 0.90 02/07/2017   HGBA1C 5.1 12/06/2014   BP Readings from Last 3 Encounters:  11/10/18 116/63  10/21/17 116/74  09/17/17 108/72    ASSESSMENT AND PLAN:  Discussed the following assessment and plan:  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   Diff dx  Foot dermatitis  Bacterial doubt fungal less likely  Without edema itch etc  .  Can send in pix I a month or as needed consider in person  Or podiatry derm check   If needed  -  Patient advised to return or notify health care team  if  new concerns arise. In interim   Return for can let us know how doing in [place] .   Shanon Ace, MD

## 2019-10-05 ENCOUNTER — Telehealth (INDEPENDENT_AMBULATORY_CARE_PROVIDER_SITE_OTHER): Payer: 59 | Admitting: Internal Medicine

## 2019-10-05 ENCOUNTER — Encounter: Payer: Self-pay | Admitting: Internal Medicine

## 2019-10-05 DIAGNOSIS — B070 Plantar wart: Secondary | ICD-10-CM | POA: Diagnosis not present

## 2019-10-13 ENCOUNTER — Ambulatory Visit: Payer: 59 | Admitting: Internal Medicine

## 2019-12-23 ENCOUNTER — Other Ambulatory Visit: Payer: Self-pay | Admitting: Internal Medicine

## 2019-12-24 ENCOUNTER — Other Ambulatory Visit: Payer: Self-pay | Admitting: Internal Medicine

## 2020-01-09 ENCOUNTER — Other Ambulatory Visit: Payer: Self-pay | Admitting: Internal Medicine

## 2020-01-24 ENCOUNTER — Other Ambulatory Visit: Payer: Self-pay | Admitting: Internal Medicine

## 2020-02-07 NOTE — Telephone Encounter (Signed)
I don't see a reason not to get the vaccine for medical reasons. I strongly advise  Getting the vaccine( safer than full blown  Covid infection )   If you are on a medication  Such as immunosuppressant more reason to get the vaccine .   At this time  Ivermectin is not advised because there are only a few well designed  controlled trials  That  dont show  A  significant difference  From a sugar pill  In early covid and prophylaxis .  So far .    The vaccine is the best   Most effective intervention we have to keep people from getting hospitalized and dying from covid infection   (Much better than getting the bad version of covid infection.  Or any of the other ideas tried so far)     If one gets covid infection   Early interventions  With best studies so far  are  Monoclonal antibody infusion   Can help severity of disease     You can ask your dermatologist also about these issues   So  I would not prescribe  ivermectin and I advise getting the  vaccine

## 2020-02-23 DIAGNOSIS — Z79899 Other long term (current) drug therapy: Secondary | ICD-10-CM

## 2020-02-23 DIAGNOSIS — Z0184 Encounter for antibody response examination: Secondary | ICD-10-CM

## 2020-02-23 DIAGNOSIS — E785 Hyperlipidemia, unspecified: Secondary | ICD-10-CM

## 2020-02-23 DIAGNOSIS — Z0000 Encounter for general adult medical examination without abnormal findings: Secondary | ICD-10-CM

## 2020-02-25 NOTE — Telephone Encounter (Signed)
Orders have been placed for fasting labs can be done prior to visit

## 2020-03-02 ENCOUNTER — Other Ambulatory Visit: Payer: 59

## 2020-03-03 NOTE — Telephone Encounter (Signed)
I ordered  Serology test   Is not predictive of immunity per se  .

## 2020-03-03 NOTE — Telephone Encounter (Signed)
Need to know why you want to do this   Usually not a clinical indication  At this time but  Need more info  About why to order correctly

## 2020-03-06 ENCOUNTER — Other Ambulatory Visit: Payer: 59

## 2020-03-06 ENCOUNTER — Other Ambulatory Visit (INDEPENDENT_AMBULATORY_CARE_PROVIDER_SITE_OTHER): Payer: 59

## 2020-03-06 ENCOUNTER — Other Ambulatory Visit: Payer: Self-pay

## 2020-03-06 DIAGNOSIS — E785 Hyperlipidemia, unspecified: Secondary | ICD-10-CM

## 2020-03-06 DIAGNOSIS — Z0184 Encounter for antibody response examination: Secondary | ICD-10-CM

## 2020-03-06 DIAGNOSIS — Z0000 Encounter for general adult medical examination without abnormal findings: Secondary | ICD-10-CM

## 2020-03-06 DIAGNOSIS — Z79899 Other long term (current) drug therapy: Secondary | ICD-10-CM

## 2020-03-07 LAB — CBC WITH DIFFERENTIAL/PLATELET
Absolute Monocytes: 516 cells/uL (ref 200–950)
Basophils Absolute: 52 cells/uL (ref 0–200)
Basophils Relative: 0.9 %
Eosinophils Absolute: 215 cells/uL (ref 15–500)
Eosinophils Relative: 3.7 %
HCT: 42.6 % (ref 35.0–45.0)
Hemoglobin: 14.6 g/dL (ref 11.7–15.5)
Lymphs Abs: 1949 cells/uL (ref 850–3900)
MCH: 35.6 pg — ABNORMAL HIGH (ref 27.0–33.0)
MCHC: 34.3 g/dL (ref 32.0–36.0)
MCV: 103.9 fL — ABNORMAL HIGH (ref 80.0–100.0)
MPV: 10.9 fL (ref 7.5–12.5)
Monocytes Relative: 8.9 %
Neutro Abs: 3068 cells/uL (ref 1500–7800)
Neutrophils Relative %: 52.9 %
Platelets: 247 10*3/uL (ref 140–400)
RBC: 4.1 10*6/uL (ref 3.80–5.10)
RDW: 12 % (ref 11.0–15.0)
Total Lymphocyte: 33.6 %
WBC: 5.8 10*3/uL (ref 3.8–10.8)

## 2020-03-07 LAB — HEPATIC FUNCTION PANEL
AG Ratio: 2 (calc) (ref 1.0–2.5)
ALT: 17 U/L (ref 6–29)
AST: 17 U/L (ref 10–35)
Albumin: 4.5 g/dL (ref 3.6–5.1)
Alkaline phosphatase (APISO): 77 U/L (ref 37–153)
Bilirubin, Direct: 0.1 mg/dL (ref 0.0–0.2)
Globulin: 2.3 g/dL (calc) (ref 1.9–3.7)
Indirect Bilirubin: 0.6 mg/dL (calc) (ref 0.2–1.2)
Total Bilirubin: 0.7 mg/dL (ref 0.2–1.2)
Total Protein: 6.8 g/dL (ref 6.1–8.1)

## 2020-03-07 LAB — LIPID PANEL
Cholesterol: 201 mg/dL — ABNORMAL HIGH (ref <200)
HDL: 80 mg/dL (ref >=50)
LDL Cholesterol (Calc): 96 mg/dL (calc)
Non-HDL Cholesterol (Calc): 121 mg/dL (calc) (ref <130)
Total CHOL/HDL Ratio: 2.5 (calc) (ref <5.0)
Triglycerides: 145 mg/dL (ref <150)

## 2020-03-07 LAB — BASIC METABOLIC PANEL WITH GFR
BUN: 14 mg/dL (ref 7–25)
CO2: 28 mmol/L (ref 20–32)
Calcium: 9.6 mg/dL (ref 8.6–10.4)
Chloride: 105 mmol/L (ref 98–110)
Creat: 0.99 mg/dL (ref 0.50–1.05)
GFR, Est African American: 73 mL/min/{1.73_m2} (ref >=60)
GFR, Est Non African American: 63 mL/min/{1.73_m2} (ref >=60)
Glucose, Bld: 96 mg/dL (ref 65–99)
Potassium: 4.6 mmol/L (ref 3.5–5.3)
Sodium: 141 mmol/L (ref 135–146)

## 2020-03-07 LAB — SARS-COV-2 ANTIBODY(IGG)SPIKE,SEMI-QUANTITATIVE: SARS COV1 AB(IGG)SPIKE,SEMI QN: <1 index (ref <1.00)

## 2020-03-07 LAB — TSH: TSH: 1.8 mIU/L (ref 0.40–4.50)

## 2020-03-07 NOTE — Progress Notes (Signed)
Negative covid abys   will discuss and review  at upcoming visit

## 2020-03-09 ENCOUNTER — Other Ambulatory Visit: Payer: Self-pay

## 2020-03-09 NOTE — Progress Notes (Signed)
Chief Complaint  Patient presents with  . Medication Management    Doing well    HPI: Jessica Cooper 59 y.o. come in for Chronic disease management  Med check  Insurance funs out end of month Generally well   Taking   atrova   without problem  citalopram helps   Stable and has been on for  Years.  Smoking more since covid shut downa nd her job furloughted  \ hopeful for new job  Having interviews  Saw derm about her feet and now on  [place] dx palmar plantar pustulosis   ROS: See pertinent positives and negatives per HPI.  hh of 2  Tons of pets .  Is laid off  For 18 months  4/5 parents  Last year  Running about   Statin  Needs 90 days .   Walking  etoh   2 daily  Tobacco   1.5 per day.  HLD :  Insurance runs at end of month .   Past Medical History:  Diagnosis Date  . Anxiety   . Cancer ([place])   . 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 Depression   . Diverticulosis   . 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 Hyperlipidemia    runs around 245 - diet controlled, no meds  . Internal hemorrhoids   . Neuromuscular disorder ([place])    partial malrotation of mid gut  . Osteopenia   . Thyroid disease    Hyperparathyroidism  . Tubular adenoma of colon     Family History  Problem Relation Age of Onset  . Heart failure Father        heart valve problem   . Hypertension Father   . Breast cancer Sister   . Crohn's disease Sister   . Heart failure Maternal Grandmother   . Colon cancer Neg Hx   . Esophageal cancer Neg Hx   . Rectal cancer Neg Hx   . Stomach cancer Neg Hx     Social History   Socioeconomic History  . Marital status: Married    Spouse name: Not on file  . Number of children: Not on file  . Years of education: Not on file  . Highest education level: Not on file  Occupational History  . Not on file  Tobacco Use  . Smoking status: Current Every Day Smoker    Packs/day: 1.00    Years: 30.00    Pack years:  30.00    Types: Cigarettes  . Smokeless tobacco: Never Used  Vaping Use  . Vaping Use: Never used  Substance and Sexual Activity  . Alcohol use: Yes    Alcohol/week: 7.0 standard drinks    Types: 7 Glasses of wine per week  . Drug use: No  . Sexual activity: Yes    Birth control/protection: Surgical    Comment: Abd Hysterectomy  Other Topics Concern  . Not on file  Social History Narrative   Occupation: Chiropodist Degree   Married   Regular exercise- no   Smoker    HH of 2   5 pets  2 dogs and cats.  One outside cat.    Moved from [place] from job   G0P0   Social Determinants of Health   Financial Resource Strain:   . Difficulty of Paying Living Expenses: Not on file  Food Insecurity:   . Worried About Charity fundraiser in the Last  Year: Not on file  . Ran Out of Food in the Last Year: Not on file  Transportation Needs:   . Lack of Transportation (Medical): Not on file  . Lack of Transportation (Non-Medical): Not on file  Physical Activity:   . Days of Exercise per Week: Not on file  . Minutes of Exercise per Session: Not on file  Stress:   . Feeling of Stress : Not on file  Social Connections:   . Frequency of Communication with Friends and Family: Not on file  . Frequency of Social Gatherings with Friends and Family: Not on file  . Attends Religious Services: Not on file  . Active Member of Clubs or Organizations: Not on file  . Attends Archivist Meetings: Not on file  . Marital Status: Not on file    Outpatient Medications Prior to Visit  Medication Sig Dispense Refill  . Calcium Citrate (CITRACAL PO) Take by mouth 2 (two) times daily.    Marland Kitchen glucosamine-chondroitin 500-400 MG tablet Take 1 tablet by mouth 3 (three) times daily.      . MULTIPLE VITAMIN PO Take by mouth. Reported on 09/20/2015    . OTEZLA 30 MG TABS Take 1 tablet by mouth 2 (two) times daily.    . Probiotic Product (ALIGN) 4 MG CAPS Take by mouth.      Marland Kitchen atorvastatin  (LIPITOR) 40 MG tablet TAKE 1 TABLET BY MOUTH  DAILY 90 tablet 3  . citalopram (CELEXA) 10 MG tablet Take 1 tablet (10 mg total) by mouth daily. Please schedule an office visit with labs for further refills. [phone] 90 tablet 0  . gabapentin (NEURONTIN) 300 MG capsule Take 1 capsule (300 mg total) by mouth 2 (two) times daily as needed for up to 7 days. 14 capsule 0   No facility-administered medications prior to visit.     EXAM:  BP 128/66   Pulse 66   Temp 98.1 F (36.7 C) (Oral)   Ht 5\' 5"  (1.651 m)   Wt 140 lb 6.4 oz (63.7 kg)   SpO2 98%   BMI 23.36 kg/m   Body mass index is 23.36 kg/m.  GENERAL: vitals reviewed and listed above, alert, oriented, appears well hydrated and in no acute distress HEENT: atraumatic, conjunctiva  clear, no obvious abnormalities on inspection of external nose and ears OP :masked  NECK: no obvious masses on inspection palpation  LUNGS: clear to auscultation bilaterally, no wheezes, rales or rhonchi, good air movement CV: HRRR, no clubbing cyanosis or  peripheral edema nl cap refill  MS: moves all extremities without noticeable focal  Abnormality Skin feet  Scaling and  Red and mosacicisim  Hands are clear  PSYCH: pleasant and cooperative, no obvious depression or anxiety Lab Results  Component Value Date   WBC 5.8 03/06/2020   HGB 14.6 03/06/2020   HCT 42.6 03/06/2020   PLT 247 03/06/2020   GLUCOSE 96 03/06/2020   CHOL 201 (H) 03/06/2020   TRIG 145 03/06/2020   HDL 80 03/06/2020   LDLDIRECT 178.9 09/18/2012   LDLCALC 96 03/06/2020   ALT 17 03/06/2020   AST 17 03/06/2020   NA 141 03/06/2020   K 4.6 03/06/2020   CL 105 03/06/2020   CREATININE 0.99 03/06/2020   BUN 14 03/06/2020   CO2 28 03/06/2020   TSH 1.80 03/06/2020   INR 0.90 02/07/2017   HGBA1C 5.1 12/06/2014   BP Readings from Last 3 Encounters:  03/10/20 128/66  11/10/18 116/63  10/21/17 116/74  ASSESSMENT AND PLAN:  Discussed the following assessment and  plan:  Hyperlipidemia, unspecified hyperlipidemia type  Smoker - advise cessation   offered when wishes   Medication management  Vascular calcification  Macrocytosis without anemia  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 Advise tobacco cessation   -Patient advised to return or notify health care team  if  new concerns arise.  Patient Instructions  Refill med   yearly visit  Reconsider covid vaccine.   tobacco cessation.   Standley Brooking. Tareva Leske M.D.

## 2020-03-10 ENCOUNTER — Ambulatory Visit: Payer: 59 | Admitting: Internal Medicine

## 2020-03-10 ENCOUNTER — Encounter: Payer: Self-pay | Admitting: Internal Medicine

## 2020-03-10 VITALS — BP 128/66 | HR 66 | Temp 98.1°F | Ht 65.0 in | Wt 140.4 lb

## 2020-03-10 DIAGNOSIS — Z79899 Other long term (current) drug therapy: Secondary | ICD-10-CM | POA: Diagnosis not present

## 2020-03-10 DIAGNOSIS — Z2821 Immunization not carried out because of patient refusal: Secondary | ICD-10-CM

## 2020-03-10 DIAGNOSIS — I998 Other disorder of circulatory system: Secondary | ICD-10-CM

## 2020-03-10 DIAGNOSIS — L403 Pustulosis palmaris et plantaris: Secondary | ICD-10-CM

## 2020-03-10 DIAGNOSIS — D7589 Other specified diseases of blood and blood-forming organs: Secondary | ICD-10-CM

## 2020-03-10 DIAGNOSIS — F17200 Nicotine dependence, unspecified, uncomplicated: Secondary | ICD-10-CM

## 2020-03-10 DIAGNOSIS — E785 Hyperlipidemia, unspecified: Secondary | ICD-10-CM | POA: Diagnosis not present

## 2020-03-10 DIAGNOSIS — Z7189 Other specified counseling: Secondary | ICD-10-CM

## 2020-03-10 MED ORDER — ATORVASTATIN CALCIUM 40 MG PO TABS: 40.0000 mg | ORAL_TABLET | Freq: Every day | ORAL | 3 refills | Status: DC

## 2020-03-10 MED ORDER — CITALOPRAM HYDROBROMIDE 10 MG PO TABS
10.0000 mg | ORAL_TABLET | Freq: Every day | ORAL | 3 refills | Status: DC
Start: 2020-03-10 — End: 2020-04-21

## 2020-03-10 NOTE — Patient Instructions (Addendum)
Refill med   yearly visit  Reconsider covid vaccine.   tobacco cessation.

## 2020-03-29 ENCOUNTER — Encounter: Payer: 59 | Admitting: Internal Medicine

## 2020-04-21 ENCOUNTER — Other Ambulatory Visit: Payer: Self-pay

## 2020-04-21 MED ORDER — CITALOPRAM HYDROBROMIDE 10 MG PO TABS
10.0000 mg | ORAL_TABLET | Freq: Every day | ORAL | 0 refills | Status: DC
Start: 2020-04-21 — End: 2020-05-10

## 2020-04-21 NOTE — Telephone Encounter (Signed)
Ok to send in 30 days and refill x 1

## 2020-05-10 MED ORDER — CITALOPRAM HYDROBROMIDE 10 MG PO TABS
10.0000 mg | ORAL_TABLET | Freq: Every day | ORAL | 1 refills | Status: DC
Start: 2020-05-10 — End: 2020-10-31

## 2020-05-10 MED ORDER — ATORVASTATIN CALCIUM 40 MG PO TABS: 40.0000 mg | ORAL_TABLET | Freq: Every day | ORAL | 1 refills | Status: DC

## 2020-05-10 NOTE — Telephone Encounter (Signed)
Please send in  90 days    With one refill to her new pharmacy for both meds

## 2020-10-31 ENCOUNTER — Other Ambulatory Visit: Payer: Self-pay | Admitting: Internal Medicine

## 2021-01-29 ENCOUNTER — Other Ambulatory Visit: Payer: Self-pay | Admitting: Internal Medicine

## 2021-04-30 ENCOUNTER — Other Ambulatory Visit: Payer: Self-pay | Admitting: Internal Medicine

## 2021-06-24 HISTORY — PX: BREAST BIOPSY: SHX20

## 2021-06-24 HISTORY — PX: REDUCTION MAMMAPLASTY: SUR839

## 2021-06-26 ENCOUNTER — Other Ambulatory Visit: Payer: Self-pay | Admitting: Internal Medicine

## 2021-06-27 ENCOUNTER — Encounter: Payer: Self-pay | Admitting: Internal Medicine

## 2021-06-27 NOTE — Telephone Encounter (Signed)
Please contact her and arrange an appointment it is been over a year. After the appointment is made send in a refill of citalopram 30 days or until her appointment time.  Whichever is longer.

## 2021-06-28 ENCOUNTER — Other Ambulatory Visit: Payer: Self-pay

## 2021-06-28 MED ORDER — CITALOPRAM HYDROBROMIDE 10 MG PO TABS: ORAL_TABLET | ORAL | 0 refills | Status: DC

## 2021-07-16 ENCOUNTER — Encounter: Payer: 59 | Admitting: Internal Medicine

## 2021-07-16 NOTE — Progress Notes (Signed)
Chief Complaint  Patient presents with   Annual Exam    HPI: Patient  Jessica Cooper  61 y.o. comes in today for Preventive Health Care visit  med check   Citalopram  still quite helpful and would like refill  Atorvastatin  not taking recentlyfor past 3 mos   not sure if caused nasal stuffiness Skin issues :   HS  dormant  since 2006 [facility] and then this month  came back  aPPP  oot[place].  Rest is ok  Still using tobacco . Going to try detox with oral borax  low dose ( has internet site )  because hidradenitis has begun to return and has read could help autoimmune issues? Due  update colon screen May dr Hilarie Fredrickson   Due for mammogram   Health Maintenance  Topic Date Due   OPHTHALMOLOGY EXAM  Never done   INFLUENZA VACCINE  01/14/2022 (Originally 01/22/2021)   MAMMOGRAM  01/14/2022 (Originally 11/22/2020)   PAP SMEAR-Modifier  01/14/2022 (Originally 06/24/2016)   COVID-19 Vaccine (1) 01/14/2022 (Originally 06/15/1961)   Zoster Vaccines- Shingrix (1 of 2) 01/14/2022 (Originally 12/13/1981)   COLONOSCOPY (Pts 45-28yrs Insurance coverage will need to be confirmed)  11/10/2023   TETANUS/TDAP  12/28/2023   Hepatitis C Screening  Completed   HIV Screening  Completed   HPV VACCINES  Aged Out   Health Maintenance Review LIFESTYLE:  Exercise:   walking.   Daily  Tobacco/ETS: 1-1.5 per day .  No resp Alcohol:  Sugar beverages: Sleep: recent sleep issue    3 weeks  7-8 hours  Drug use: no HH of  2  tons of pets cats and dogs Work:  Stopped   atorvastatin  stuffy nose.  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   Depression    Diverticulosis    Hip pain    referred femoral pain; itband syndrome and snapping hip syndrome   Hyperlipidemia    runs around 245 - diet controlled, no meds   Internal hemorrhoids    Neuromuscular disorder ([facility])    partial malrotation  of mid gut   Osteopenia    Thyroid disease    Hyperparathyroidism   Tubular adenoma of colon     Past Surgical History:  Procedure Laterality Date   ABDOMINAL HYSTERECTOMY  08/2008   fibroid bleeding   BREAST BIOPSY Right    Aug 2015-"tracker' placed for hyperplasia cells   CERVICAL CONE BIOPSY     COLONOSCOPY     IR RADIOLOGIST EVAL & MGMT  02/04/2017   MOHS SURGERY     PARATHYROIDECTOMY Bilateral    POLYPECTOMY     WISDOM TOOTH EXTRACTION      Family History  Problem Relation Age of Onset   Heart failure Father        heart valve problem    Hypertension Father    Breast cancer Sister    Crohn's disease Sister    Heart failure Maternal Grandmother    Colon cancer Neg Hx    Esophageal cancer Neg Hx    Rectal cancer Neg Hx    Stomach cancer Neg Hx     Social History   Socioeconomic History   Marital status: Married    Spouse name: Not on file   Number of children: Not on file   Years of  education: Not on file   Highest education level: Not on file  Occupational History   Not on file  Tobacco Use   Smoking status: Every Day    Packs/day: 1.00    Years: 30.00    Pack years: 30.00    Types: Cigarettes   Smokeless tobacco: Never  Vaping Use   Vaping Use: Never used  Substance and Sexual Activity   Alcohol use: Yes    Alcohol/week: 7.0 standard drinks    Types: 7 Glasses of wine per week   Drug use: No   Sexual activity: Yes    Birth control/protection: Surgical    Comment: Abd Hysterectomy  Other Topics Concern   Not on file  Social History Narrative   Occupation: Chiropodist Degree   Married   Regular exercise- no   Smoker    HH of 2   5 pets  2 dogs and cats.  One outside cat.    Moved from [place] from job   [id number]   Social Determinants of Health   Financial Resource Strain: Not on file  Food Insecurity: Not on file  Transportation Needs: Not on file  Physical Activity: Not on file  Stress: Not on file  Social Connections: Not on  file    Outpatient Medications Prior to Visit  Medication Sig Dispense Refill   atorvastatin (LIPITOR) 40 MG tablet Take 1 tablet (40 mg total) by mouth daily. 90 tablet 1   Calcium Citrate (CITRACAL PO) Take by mouth 2 (two) times daily.     glucosamine-chondroitin 500-400 MG tablet Take 1 tablet by mouth 3 (three) times daily.       MULTIPLE VITAMIN PO Take by mouth. Reported on 09/20/2015     OTEZLA 30 MG TABS Take 1 tablet by mouth 2 (two) times daily.     Probiotic Product (ALIGN) 4 MG CAPS Take by mouth.       citalopram (CELEXA) 10 MG tablet TAKE 1 TABLET DAILY (NEED APPOINTMENT) 30 tablet 0   gabapentin (NEURONTIN) 300 MG capsule Take 1 capsule (300 mg total) by mouth 2 (two) times daily as needed for up to 7 days. 14 capsule 0   No facility-administered medications prior to visit.     EXAM:  BP 118/76 (BP Location: Left Arm, Patient Position: Sitting, Cuff Size: Normal)    Pulse 64    Temp 98.6 F (37 C) (Oral)    Ht 5\' 5"  (1.651 m)    Wt 144 lb 9.6 oz (65.6 kg)    SpO2 98%    BMI 24.06 kg/m   Body mass index is 24.06 kg/m. Wt Readings from Last 3 Encounters:  07/17/21 144 lb 9.6 oz (65.6 kg)  03/10/20 140 lb 6.4 oz (63.7 kg)  11/10/18 139 lb (63 kg)    Physical Exam: Vital signs reviewed KPT:WSFK is a well-developed well-nourished alert cooperative    who appearsr stated age in no acute distress.  HEENT: normocephalic atraumatic , Eyes: PERRL EOM's full, conjunctiva clear, Nares: paten,t no deformity discharge or tenderness., Ears: no deformity EAC's clear TMs with normal landmarks. Mouth:masked  NECK: supple without masses, thyromegaly or bruits. CHEST/PULM:  Clear to auscultation and percussion breath sounds equal no wheeze , rales or rhonchi. No chest wall deformities or tenderness. Breast: normal by inspection . No dimpling, discharge, masses, tenderness or discharge . CV: PMI is nondisplaced, S1 S2 no gallops, murmurs, rubs. Peripheral pulses are full without  delay.No JVD .  ABDOMEN: Bowel sounds normal  nontender  No guard or rebound, no hepato splenomegal no CVA tenderness.   Extremtities:  No clubbing cyanosis or edema, no acute joint swelling or redness no focal atrophy NEURO:  Oriented x3, cranial nerves 3-12 appear to be intact, no obvious focal weakness,gait within normal limits no abnormal reflexes or asymmetrical SKIN: normal turgor, color, no bruising or petechiae. Feet with scaly peeling rash bilateral  PSYCH: Oriented, good eye contact, no obvious depression anxiety, cognition and judgment appear normal. LN: no cervical axillary inguinal adenopathy  Lab Results  Component Value Date   WBC 8.0 07/17/2021   HGB 14.8 07/17/2021   HCT 44.1 07/17/2021   PLT 257.0 07/17/2021   GLUCOSE 94 07/17/2021   CHOL 270 (H) 07/17/2021   TRIG 130.0 07/17/2021   HDL 74.00 07/17/2021   LDLDIRECT 178.9 09/18/2012   LDLCALC 170 (H) 07/17/2021   ALT 13 07/17/2021   AST 18 07/17/2021   NA 140 07/17/2021   K 4.1 07/17/2021   CL 104 07/17/2021   CREATININE 0.84 07/17/2021   BUN 13 07/17/2021   CO2 28 07/17/2021   TSH 1.31 07/17/2021   INR 0.90 02/07/2017   HGBA1C 5.6 07/17/2021    BP Readings from Last 3 Encounters:  07/17/21 118/76  03/10/20 128/66  11/10/18 116/63    Lab plan  reviewed with patient  noon mocha   ASSESSMENT AND PLAN:  Discussed the following assessment and plan:    ICD-10-CM   1. Visit for preventive health examination  Y81.44 Basic metabolic panel    CBC with Differential/Platelet    Hemoglobin A1c    Hepatic function panel    Lipid panel    TSH    TSH    Lipid panel    Hepatic function panel    Hemoglobin A1c    CBC with Differential/Platelet    Basic metabolic panel    2. Medication management  Y18.563 Basic metabolic panel    CBC with Differential/Platelet    Hemoglobin A1c    Hepatic function panel    Lipid panel    TSH    TSH    Lipid panel    Hepatic function panel    Hemoglobin A1c    CBC  with Differential/Platelet    Basic metabolic panel    3. Hyperlipidemia, unspecified hyperlipidemia type  J49.7 Basic metabolic panel    CBC with Differential/Platelet    Hemoglobin A1c    Hepatic function panel    Lipid panel    TSH    TSH    Lipid panel    Hepatic function panel    Hemoglobin A1c    CBC with Differential/Platelet    Basic metabolic panel   uncertain if atorva cause nasla stuffiness    4. Vascular calcification  W26.3 Basic metabolic panel    CBC with Differential/Platelet    Hemoglobin A1c    Hepatic function panel    Lipid panel    TSH    TSH    Lipid panel    Hepatic function panel    Hemoglobin A1c    CBC with Differential/Platelet    Basic metabolic panel    5. Influenza vaccination declined by patient  Z28.21     6. 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 H/O hidradenitis suppurativa  Z87.2     8. TOBACCO USE  F17.200     Refilled citalopram advise stop tobacco ( she is aware)  Advise   no borax as it is  a poisonous chemical and doubt goo evidence it will help you.  Return for depending on results 6-12 months .  Patient Care Team: Helios Kohlmann, Standley Brooking, MD as PCP - General Draelos, Zoe, MD (Dermatology) Judie Bonus, MD (Obstetrics and Gynecology) Jeannette How. Arredondo (Breast Surgery) Croitoru, Dani Gobble, MD as Consulting Physician (Cardiology) Patient Instructions  Good to see  you  today . Of course advise stop tobacco . Advise against Internal borax  no known good evidence of safety and efficacy since it is a  poison.    Lab today .   Consider  a different  cholesterol medication  if we think that atorva causes nasal stuffiness.   Get  your mammogram .   Standley Brooking. Alease Fait M.D.

## 2021-07-17 ENCOUNTER — Ambulatory Visit (INDEPENDENT_AMBULATORY_CARE_PROVIDER_SITE_OTHER): Payer: Managed Care, Other (non HMO) | Admitting: Internal Medicine

## 2021-07-17 ENCOUNTER — Encounter: Payer: Self-pay | Admitting: Internal Medicine

## 2021-07-17 VITALS — BP 118/76 | HR 64 | Temp 98.6°F | Ht 65.0 in | Wt 144.6 lb

## 2021-07-17 DIAGNOSIS — Z872 Personal history of diseases of the skin and subcutaneous tissue: Secondary | ICD-10-CM

## 2021-07-17 DIAGNOSIS — L403 Pustulosis palmaris et plantaris: Secondary | ICD-10-CM

## 2021-07-17 DIAGNOSIS — Z0000 Encounter for general adult medical examination without abnormal findings: Secondary | ICD-10-CM

## 2021-07-17 DIAGNOSIS — E785 Hyperlipidemia, unspecified: Secondary | ICD-10-CM | POA: Diagnosis not present

## 2021-07-17 DIAGNOSIS — Z2821 Immunization not carried out because of patient refusal: Secondary | ICD-10-CM

## 2021-07-17 DIAGNOSIS — I998 Other disorder of circulatory system: Secondary | ICD-10-CM

## 2021-07-17 DIAGNOSIS — Z79899 Other long term (current) drug therapy: Secondary | ICD-10-CM | POA: Diagnosis not present

## 2021-07-17 DIAGNOSIS — F17200 Nicotine dependence, unspecified, uncomplicated: Secondary | ICD-10-CM

## 2021-07-17 MED ORDER — CITALOPRAM HYDROBROMIDE 10 MG PO TABS: ORAL_TABLET | ORAL | 3 refills | Status: DC

## 2021-07-17 NOTE — Patient Instructions (Addendum)
Good to see  you  today . Of course advise stop tobacco . Advise against Internal borax  no known good evidence of safety and efficacy since it is a  poison.    Lab today .   Consider  a different  cholesterol medication  if we think that atorva causes nasal stuffiness.   Get  your mammogram .

## 2021-07-18 LAB — CBC WITH DIFFERENTIAL/PLATELET
Basophils Absolute: 0.1 K/uL (ref 0.0–0.1)
Basophils Relative: 1.3 % (ref 0.0–3.0)
Eosinophils Absolute: 0.2 K/uL (ref 0.0–0.7)
Eosinophils Relative: 2 % (ref 0.0–5.0)
HCT: 44.1 % (ref 36.0–46.0)
Hemoglobin: 14.8 g/dL (ref 12.0–15.0)
Lymphocytes Relative: 26.1 % (ref 12.0–46.0)
Lymphs Abs: 2.1 K/uL (ref 0.7–4.0)
MCHC: 33.5 g/dL (ref 30.0–36.0)
MCV: 103.2 fl — ABNORMAL HIGH (ref 78.0–100.0)
Monocytes Absolute: 0.5 K/uL (ref 0.1–1.0)
Monocytes Relative: 5.9 % (ref 3.0–12.0)
Neutro Abs: 5.2 K/uL (ref 1.4–7.7)
Neutrophils Relative %: 64.7 % (ref 43.0–77.0)
Platelets: 257 K/uL (ref 150.0–400.0)
RBC: 4.27 Mil/uL (ref 3.87–5.11)
RDW: 12.7 % (ref 11.5–15.5)
WBC: 8 K/uL (ref 4.0–10.5)

## 2021-07-18 LAB — LIPID PANEL
Cholesterol: 270 mg/dL — ABNORMAL HIGH (ref 0–200)
HDL: 74 mg/dL (ref >39.00)
LDL Cholesterol: 170 mg/dL — ABNORMAL HIGH (ref 0–99)
NonHDL: 195.58
Total CHOL/HDL Ratio: 4
Triglycerides: 130 mg/dL (ref 0.0–149.0)
VLDL: 26 mg/dL (ref 0.0–40.0)

## 2021-07-18 LAB — HEPATIC FUNCTION PANEL
ALT: 13 U/L (ref 0–35)
AST: 18 U/L (ref 0–37)
Albumin: 4.6 g/dL (ref 3.5–5.2)
Alkaline Phosphatase: 68 U/L (ref 39–117)
Bilirubin, Direct: 0 mg/dL (ref 0.0–0.3)
Total Bilirubin: 0.4 mg/dL (ref 0.2–1.2)
Total Protein: 7.5 g/dL (ref 6.0–8.3)

## 2021-07-18 LAB — BASIC METABOLIC PANEL
BUN: 13 mg/dL (ref 6–23)
CO2: 28 mEq/L (ref 19–32)
Calcium: 9.4 mg/dL (ref 8.4–10.5)
Chloride: 104 mEq/L (ref 96–112)
Creatinine, Ser: 0.84 mg/dL (ref 0.40–1.20)
GFR: 76.51 mL/min (ref >60.00)
Glucose, Bld: 94 mg/dL (ref 70–99)
Potassium: 4.1 mEq/L (ref 3.5–5.1)
Sodium: 140 mEq/L (ref 135–145)

## 2021-07-18 LAB — TSH: TSH: 1.31 u[IU]/mL (ref 0.35–5.50)

## 2021-07-18 LAB — HEMOGLOBIN A1C: Hgb A1c MFr Bld: 5.6 % (ref 4.6–6.5)

## 2021-07-22 ENCOUNTER — Encounter: Payer: Self-pay | Admitting: Internal Medicine

## 2021-07-22 DIAGNOSIS — I998 Other disorder of circulatory system: Secondary | ICD-10-CM

## 2021-07-22 DIAGNOSIS — Z79899 Other long term (current) drug therapy: Secondary | ICD-10-CM

## 2021-07-22 DIAGNOSIS — D0512 Intraductal carcinoma in situ of left breast: Secondary | ICD-10-CM

## 2021-07-22 DIAGNOSIS — E785 Hyperlipidemia, unspecified: Secondary | ICD-10-CM

## 2021-07-22 DIAGNOSIS — L403 Pustulosis palmaris et plantaris: Secondary | ICD-10-CM | POA: Insufficient documentation

## 2021-07-22 NOTE — Progress Notes (Signed)
So cholesterol is up quite high as we expected.  Rest of labs stable. Or normal  Please retry the atorvastatin to see if it really was causing the nasal stuffiness. Plan recheck fasting lipid panel in 3 months. If you do not feel you can take the atorvastatin let us know and we will try a different medication.

## 2021-07-23 ENCOUNTER — Encounter: Payer: Self-pay | Admitting: Internal Medicine

## 2021-07-23 ENCOUNTER — Other Ambulatory Visit: Payer: Self-pay

## 2021-07-23 MED ORDER — ATORVASTATIN CALCIUM 40 MG PO TABS: 40.0000 mg | ORAL_TABLET | Freq: Every day | ORAL | 3 refills | Status: DC

## 2021-07-23 NOTE — Telephone Encounter (Signed)
Rx sent 

## 2021-07-23 NOTE — Progress Notes (Signed)
Please send in 90 days  refill x 1 year as patient requested

## 2021-08-02 ENCOUNTER — Encounter: Payer: Self-pay | Admitting: Internal Medicine

## 2021-08-14 LAB — HM MAMMOGRAPHY: HM Mammogram: ABNORMAL — AB (ref 0–4)

## 2021-08-15 ENCOUNTER — Encounter: Payer: Self-pay | Admitting: Internal Medicine

## 2021-11-07 NOTE — Addendum Note (Signed)
Addended byShanon Ace K on: 11/07/2021 10:16 AM ? ? Modules accepted: Orders ? ?

## 2021-11-07 NOTE — Telephone Encounter (Signed)
Sure can ? ? future orders have been place for fasting lipid panel  and vit D  ? ?Call for lab appt time   any time  ?

## 2021-11-27 ENCOUNTER — Other Ambulatory Visit (INDEPENDENT_AMBULATORY_CARE_PROVIDER_SITE_OTHER): Payer: Managed Care, Other (non HMO)

## 2021-11-27 DIAGNOSIS — Z79899 Other long term (current) drug therapy: Secondary | ICD-10-CM

## 2021-11-27 DIAGNOSIS — E785 Hyperlipidemia, unspecified: Secondary | ICD-10-CM

## 2021-11-27 DIAGNOSIS — D0512 Intraductal carcinoma in situ of left breast: Secondary | ICD-10-CM

## 2021-11-27 DIAGNOSIS — I998 Other disorder of circulatory system: Secondary | ICD-10-CM

## 2021-11-27 LAB — LIPID PANEL
Cholesterol: 174 mg/dL (ref 0–200)
HDL: 70.2 mg/dL (ref >39.00)
LDL Cholesterol: 80 mg/dL (ref 0–99)
NonHDL: 103.36
Total CHOL/HDL Ratio: 2
Triglycerides: 119 mg/dL (ref 0.0–149.0)
VLDL: 23.8 mg/dL (ref 0.0–40.0)

## 2021-11-27 LAB — VITAMIN D 25 HYDROXY (VIT D DEFICIENCY, FRACTURES): VITD: 95.75 ng/mL (ref 30.00–100.00)

## 2021-12-17 ENCOUNTER — Encounter: Payer: Self-pay | Admitting: Internal Medicine

## 2022-01-11 ENCOUNTER — Encounter: Payer: Self-pay | Admitting: Internal Medicine

## 2022-01-24 ENCOUNTER — Ambulatory Visit (AMBULATORY_SURGERY_CENTER): Payer: Self-pay | Admitting: *Deleted

## 2022-01-24 VITALS — Ht 65.75 in | Wt 140.0 lb

## 2022-01-24 DIAGNOSIS — Z86010 Personal history of colonic polyps: Secondary | ICD-10-CM

## 2022-01-24 MED ORDER — NA SULFATE-K SULFATE-MG SULF 17.5-3.13-1.6 GM/177ML PO SOLN: 2.0000 | Freq: Once | ORAL | 0 refills | Status: AC

## 2022-01-24 NOTE — Progress Notes (Signed)
No egg or soy allergy known to patient  No issues known to pt with past sedation with any surgeries or procedures Patient denies ever being told they had issues or difficulty with intubation  No FH of Malignant Hyperthermia Pt is not on diet pills Pt is not on  home 02  Pt is not on blood thinners  Pt denies issues with constipation  No A fib or A flutter  PV completed over the phone, questions answered and procedure explained. Pt encouraged to call with any concerns or additional questions.

## 2022-02-13 ENCOUNTER — Encounter: Payer: Self-pay | Admitting: Internal Medicine

## 2022-02-20 ENCOUNTER — Encounter: Payer: Self-pay | Admitting: Internal Medicine

## 2022-02-20 ENCOUNTER — Ambulatory Visit (AMBULATORY_SURGERY_CENTER): Payer: Managed Care, Other (non HMO) | Admitting: Internal Medicine

## 2022-02-20 VITALS — BP 134/67 | HR 61 | Temp 96.8°F | Resp 14 | Ht 65.0 in | Wt 140.0 lb

## 2022-02-20 DIAGNOSIS — D120 Benign neoplasm of cecum: Secondary | ICD-10-CM

## 2022-02-20 DIAGNOSIS — Z86010 Personal history of colonic polyps: Secondary | ICD-10-CM | POA: Diagnosis not present

## 2022-02-20 DIAGNOSIS — Z09 Encounter for follow-up examination after completed treatment for conditions other than malignant neoplasm: Secondary | ICD-10-CM | POA: Diagnosis present

## 2022-02-20 NOTE — Progress Notes (Signed)
To pacu, VSS. Report to [email or web address] 

## 2022-02-20 NOTE — Op Note (Signed)
[facility] Patient Name: Jessica Cooper Procedure Date: 02/20/2022 11:06 AM MRN: [id number] Endoscopist: Jerene Bears , MD Age: 61 Referring MD:  Date of Birth: 11-Dec-1960 Gender: Female Account #: [id number] Procedure:                Colonoscopy Indications:              High risk colon cancer surveillance: Personal                            history of multiple adenomas, Last colonoscopy: May                            2020 (3 adenomas); May 2017 (6 adenomas) Medicines:                Monitored Anesthesia Care Procedure:                Pre-Anesthesia Assessment:                           - Prior to the procedure, a History and Physical                            was performed, and patient medications and                            allergies were reviewed. The patient's tolerance of                            previous anesthesia was also reviewed. The risks                            and benefits of the procedure and the sedation                            options and risks were discussed with the patient.                            All questions were answered, and informed consent                            was obtained. Prior Anticoagulants: The patient has                            taken no previous anticoagulant or antiplatelet                            agents. ASA Grade Assessment: II - A patient with                            mild systemic disease. After reviewing the risks                            and benefits, the patient was deemed in  satisfactory condition to undergo the procedure.                           After obtaining informed consent, the colonoscope                            was passed under direct vision. Throughout the                            procedure, the patient's blood pressure, pulse, and                            oxygen saturations were monitored continuously. The                            Olympus PCF-H190DL  ([id number]) Colonoscope was                            introduced through the anus and advanced to the                            cecum, identified by appendiceal orifice and                            ileocecal valve. The colonoscopy was performed                            without difficulty. The patient tolerated the                            procedure well. The quality of the bowel                            preparation was good. The ileocecal valve,                            appendiceal orifice, and rectum were photographed. Scope In: 11:23:38 AM Scope Out: 11:38:15 AM Scope Withdrawal Time: 0 hours 10 minutes 10 seconds  Total Procedure Duration: 0 hours 14 minutes 37 seconds  Findings:                 The digital rectal exam was normal.                           A 4 mm polyp was found in the cecum. The polyp was                            sessile. The polyp was removed with a cold snare.                            Resection and retrieval were complete.                           Multiple small-mouthed diverticula were found in  the sigmoid colon.                           The exam was otherwise without abnormality on                            direct and retroflexion views. Complications:            No immediate complications. Estimated Blood Loss:     Estimated blood loss was minimal. Impression:               - One 4 mm polyp in the cecum, removed with a cold                            snare. Resected and retrieved.                           - Diverticulosis in the sigmoid colon.                           - The examination was otherwise normal on direct                            and retroflexion views. Recommendation:           - Patient has a contact number available for                            emergencies. The signs and symptoms of potential                            delayed complications were discussed with the                             patient. Return to normal activities tomorrow.                            Written discharge instructions were provided to the                            patient.                           - Resume previous diet.                           - Continue present medications.                           - Await pathology results.                           - Repeat colonoscopy in 5 years for surveillance. Jerene Bears, MD 02/20/2022 11:40:53 AM This report has been signed electronically.

## 2022-02-20 NOTE — Patient Instructions (Signed)
Resume previous diet and medications. Awaiting pathology results. Repeat Colonoscopy date to be determined based on pathology results.  YOU HAD AN ENDOSCOPIC PROCEDURE TODAY AT [facility]:   Refer to the procedure report that was given to you for any specific questions about what was found during the examination.  If the procedure report does not answer your questions, please call your gastroenterologist to clarify.  If you requested that your care partner not be given the details of your procedure findings, then the procedure report has been included in a sealed envelope for you to review at your convenience later.  YOU SHOULD EXPECT: Some feelings of bloating in the abdomen. Passage of more gas than usual.  Walking can help get rid of the air that was put into your GI tract during the procedure and reduce the bloating. If you had a lower endoscopy (such as a colonoscopy or flexible sigmoidoscopy) you may notice spotting of blood in your stool or on the toilet paper. If you underwent a bowel prep for your procedure, you may not have a normal bowel movement for a few days.  Please Note:  You might notice some irritation and congestion in your nose or some drainage.  This is from the oxygen used during your procedure.  There is no need for concern and it should clear up in a day or so.  SYMPTOMS TO REPORT IMMEDIATELY:  Following lower endoscopy (colonoscopy or flexible sigmoidoscopy):  Excessive amounts of blood in the stool  Significant tenderness or worsening of abdominal pains  Swelling of the abdomen that is new, acute  Fever of 100F or higher  For urgent or emergent issues, a gastroenterologist can be reached at any hour by calling [phone]. Do not use MyChart messaging for urgent concerns.    DIET:  We do recommend a small meal at first, but then you may proceed to your regular diet.  Drink plenty of fluids but you should avoid alcoholic beverages for 24  hours.  ACTIVITY:  You should plan to take it easy for the rest of today and you should NOT DRIVE or use heavy machinery until tomorrow (because of the sedation medicines used during the test).    FOLLOW UP: Our staff will call the number listed on your records the next business day following your procedure.  We will call around 7:15- 8:00 am to check on you and address any questions or concerns that you may have regarding the information given to you following your procedure. If we do not reach you, we will leave a message.  If you develop any symptoms (ie: fever, flu-like symptoms, shortness of breath, cough etc.) before then, please call [phone].  If you test positive for Covid 19 in the 2 weeks post procedure, please call and report this information to [place].    If any biopsies were taken you will be contacted by phone or by letter within the next 1-3 weeks.  Please call us at [phone] if you have not heard about the biopsies in 3 weeks.    SIGNATURES/CONFIDENTIALITY: You and/or your care partner have signed paperwork which will be entered into your electronic medical record.  These signatures attest to the fact that that the information above on your After Visit Summary has been reviewed and is understood.  Full responsibility of the confidentiality of this discharge information lies with you and/or your care-partner.

## 2022-02-20 NOTE — Progress Notes (Signed)
GASTROENTEROLOGY PROCEDURE H&P NOTE   Primary Care Physician: Burnis Medin, MD    Reason for Procedure:  History of adenomatous colon polyps  Plan:    Surveillance colonoscopy  Patient is appropriate for endoscopic procedure(s) in the ambulatory ([place]) setting.  The nature of the procedure, as well as the risks, benefits, and alternatives were carefully and thoroughly reviewed with the patient. Ample time for discussion and questions allowed. The patient understood, was satisfied, and agreed to proceed.     HPI: Jessica Cooper is a 61 y.o. female who presents for surveillance colonoscopy.  Medical history as below.  Tolerated the prep.  No recent chest pain or shortness of breath.  No abdominal pain today.  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   Depression    Diverticulosis    Hip pain    referred femoral pain; itband syndrome and snapping hip syndrome   Hyperlipidemia    runs around 245 - diet controlled, no meds   Internal hemorrhoids    Neuromuscular disorder (HCC)    partial malrotation of mid gut   Osteopenia    Thyroid disease    Hyperparathyroidism   Tubular adenoma of colon     Past Surgical History:  Procedure Laterality Date   ABDOMINAL HYSTERECTOMY  08/2008   fibroid bleeding   BREAST BIOPSY Right    Aug 2015-"tracker' placed for hyperplasia cells   BREAST BIOPSY Left 2023   CERVICAL CONE BIOPSY     COLONOSCOPY     IR RADIOLOGIST EVAL & MGMT  02/04/2017   MOHS SURGERY     PARATHYROIDECTOMY Bilateral    POLYPECTOMY     REDUCTION MAMMAPLASTY  2023   WISDOM TOOTH EXTRACTION      Prior to Admission medications   Medication Sig Start Date End Date Taking? Authorizing Provider  atorvastatin (LIPITOR) 40 MG tablet Take 1 tablet (40 mg total) by mouth daily. 07/23/21  Yes Panosh, Standley Brooking, MD  Biotin 5000 MCG SUBL Place under the tongue.   Yes [provider]  Calcium Citrate  (CITRACAL PO) Take by mouth 2 (two) times daily.   Yes [provider]  Cholecalciferol 125 MCG (5000 UT) TABS Take 5,000 units (1 tablet) by mouth every other day, alternating with 10,000 units (2 tablets) every other day.   Yes [provider]  citalopram (CELEXA) 10 MG tablet TAKE 1 TABLET DAILY 07/17/21  Yes Panosh, Standley Brooking, MD  glucosamine-chondroitin 500-400 MG tablet Take 1 tablet by mouth 3 (three) times daily.     Yes [provider]  MULTIPLE VITAMIN PO Take by mouth. Reported on 09/20/2015   Yes [provider]  OTEZLA 30 MG TABS Take 1 tablet by mouth 2 (two) times daily. 02/24/20  Yes [provider]  Probiotic Product (ALIGN) 4 MG CAPS Take by mouth.     Yes [provider]  gabapentin (NEURONTIN) 300 MG capsule Take 1 capsule (300 mg total) by mouth 2 (two) times daily as needed for up to 7 days. 04/20/19 04/27/19  Couture, Cortni S, PA-C  tamoxifen (NOLVADEX) 10 MG tablet Take by mouth. Patient not taking: Reported on 02/20/2022 02/14/22   [provider]    Current Outpatient Medications  Medication Sig Dispense Refill   atorvastatin (LIPITOR) 40 MG tablet Take 1 tablet (40 mg total) by mouth daily. 90 tablet 3   Biotin 5000 MCG  SUBL Place under the tongue.     Calcium Citrate (CITRACAL PO) Take by mouth 2 (two) times daily.     Cholecalciferol 125 MCG (5000 UT) TABS Take 5,000 units (1 tablet) by mouth every other day, alternating with 10,000 units (2 tablets) every other day.     citalopram (CELEXA) 10 MG tablet TAKE 1 TABLET DAILY 90 tablet 3   glucosamine-chondroitin 500-400 MG tablet Take 1 tablet by mouth 3 (three) times daily.       MULTIPLE VITAMIN PO Take by mouth. Reported on 09/20/2015     OTEZLA 30 MG TABS Take 1 tablet by mouth 2 (two) times daily.     Probiotic Product (ALIGN) 4 MG CAPS Take by mouth.       gabapentin (NEURONTIN) 300 MG capsule Take 1 capsule (300 mg total) by mouth 2 (two) times daily as  needed for up to 7 days. 14 capsule 0   tamoxifen (NOLVADEX) 10 MG tablet Take by mouth. (Patient not taking: Reported on 02/20/2022)     No current facility-administered medications for this visit.    Allergies as of 02/20/2022 - Review Complete 02/20/2022  Allergen Reaction Noted   Metronidazole Rash     Family History  Problem Relation Age of Onset   Heart failure Father        heart valve problem    Hypertension Father    Breast cancer Sister    Crohn's disease Sister    Heart failure Maternal Grandmother    Colon cancer Neg Hx    Esophageal cancer Neg Hx    Rectal cancer Neg Hx    Stomach cancer Neg Hx     Social History   Socioeconomic History   Marital status: Married    Spouse name: Not on file   Number of children: Not on file   Years of education: Not on file   Highest education level: Not on file  Occupational History   Not on file  Tobacco Use   Smoking status: Every Day    Packs/day: 1.00    Years: 30.00    Total pack years: 30.00    Types: Cigarettes   Smokeless tobacco: Never  Vaping Use   Vaping Use: Never used  Substance and Sexual Activity   Alcohol use: Yes    Alcohol/week: 7.0 standard drinks of alcohol    Types: 7 Glasses of wine per week   Drug use: No   Sexual activity: Yes    Birth control/protection: Surgical    Comment: Abd Hysterectomy  Other Topics Concern   Not on file  Social History Narrative   Occupation: Chiropodist Degree   Married   Regular exercise- no   Smoker    HH of 2   5 pets  2 dogs and cats.  One outside cat.    Moved from [place] from job   G0P0   Social Determinants of Health   Financial Resource Strain: Not on file  Food Insecurity: Not on file  Transportation Needs: Not on file  Physical Activity: Not on file  Stress: Not on file  Social Connections: Not on file  Intimate Partner Violence: Not on file    Physical Exam: Vital signs in last 24 hours: '@BP'$  (!) 89/66   Pulse 82   Temp  (!) 96.8 F (36 C)   Ht '5\' 5"'$  (1.651 m)   Wt 140 lb (63.5 kg)   SpO2 96%   BMI 23.30 kg/m  GEN: NAD EYE:  Sclerae anicteric ENT: MMM CV: Non-tachycardic Pulm: CTA b/l GI: Soft, NT/ND NEURO:  Alert & Oriented x 3   Zenovia Jarred, MD [facility]  02/20/2022 11:13 AM

## 2022-02-20 NOTE — Progress Notes (Signed)
Called to room to assist during endoscopic procedure.  Patient ID and intended procedure confirmed with present staff. Received instructions for my participation in the procedure from the performing physician.  

## 2022-02-20 NOTE — Progress Notes (Signed)
Pt's states no medical or surgical changes since previsit or office visit. 

## 2022-02-21 ENCOUNTER — Telehealth: Payer: Self-pay

## 2022-02-21 NOTE — Telephone Encounter (Signed)
  Follow up Call-     02/20/2022   10:42 AM  Call back number  Post procedure Call Back phone  # [phone]  Permission to leave phone message Yes     Patient questions:  Do you have a fever, pain , or abdominal swelling? No Pain Score  0 *  Have you tolerated food without any problems? Yes.    Have you been able to return to your normal activities? Yes.    Do you have any questions about your discharge instructions: Diet   No. Medications  No. Follow up visit  No.  Do you have questions or concerns about your Care? No.  Actions: * If pain score is 4 or above: No action needed, pain <4.

## 2022-03-04 ENCOUNTER — Encounter: Payer: Self-pay | Admitting: Internal Medicine

## 2022-07-08 ENCOUNTER — Other Ambulatory Visit: Payer: Self-pay | Admitting: Internal Medicine

## 2022-07-12 ENCOUNTER — Other Ambulatory Visit: Payer: Self-pay | Admitting: Internal Medicine

## 2022-08-26 ENCOUNTER — Encounter: Payer: Self-pay | Admitting: Internal Medicine

## 2022-08-26 ENCOUNTER — Other Ambulatory Visit: Payer: Self-pay | Admitting: Family

## 2022-08-26 MED ORDER — CITALOPRAM HYDROBROMIDE 10 MG PO TABS: ORAL_TABLET | ORAL | 0 refills | Status: DC

## 2022-09-09 ENCOUNTER — Other Ambulatory Visit: Payer: Self-pay | Admitting: Family

## 2022-09-09 NOTE — Progress Notes (Unsigned)
No chief complaint on file.   HPI: Patient  Jessica Cooper  62 y.o. comes in today for Preventive Health Care visit   HLD Citalopram Breast cancer:   Health Maintenance  Topic Date Due   COVID-19 Vaccine (1) Never done   OPHTHALMOLOGY EXAM  Never done   Diabetic kidney evaluation - Urine ACR  Never done   Zoster Vaccines- Shingrix (1 of 2) Never done   PAP SMEAR-Modifier  06/24/2016   Lung Cancer Screening  08/15/2018   INFLUENZA VACCINE  01/22/2022   Diabetic kidney evaluation - eGFR measurement  07/17/2022   MAMMOGRAM  08/15/2023   DTaP/Tdap/Td (3 - Td or Tdap) 12/28/2023   COLONOSCOPY (Pts 45-18yrs Insurance coverage will need to be confirmed)  02/21/2027   Hepatitis C Screening  Completed   HIV Screening  Completed   HPV VACCINES  Aged Out   Health Maintenance Review LIFESTYLE:  Exercise:   Tobacco/ETS: Alcohol:  Sugar beverages: Sleep: Drug use: no HH of  Work:    ROS:  GEN/ HEENT: No fever, significant weight changes sweats headaches vision problems hearing changes, CV/ PULM; No chest pain shortness of breath cough, syncope,edema  change in exercise tolerance. GI /GU: No adominal pain, vomiting, change in bowel habits. No blood in the stool. No significant GU symptoms. SKIN/HEME: ,no acute skin rashes suspicious lesions or bleeding. No lymphadenopathy, nodules, masses.  NEURO/ PSYCH:  No neurologic signs such as weakness numbness. No depression anxiety. IMM/ Allergy: No unusual infections.  Allergy .   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   Depression    Diverticulosis    Hip pain    referred femoral pain; itband syndrome and snapping hip syndrome   Hyperlipidemia    runs around 245 - diet controlled, no meds   Internal hemorrhoids    Neuromuscular disorder (HCC)    partial malrotation of mid gut   Osteopenia    Thyroid  disease    Hyperparathyroidism   Tubular adenoma of colon     Past Surgical History:  Procedure Laterality Date   ABDOMINAL HYSTERECTOMY  08/2008   fibroid bleeding   BREAST BIOPSY Right    Aug 2015-"tracker' placed for hyperplasia cells   BREAST BIOPSY Left 2023   CERVICAL CONE BIOPSY     COLONOSCOPY     IR RADIOLOGIST EVAL & MGMT  02/04/2017   MOHS SURGERY     PARATHYROIDECTOMY Bilateral    POLYPECTOMY     REDUCTION MAMMAPLASTY  2023   WISDOM TOOTH EXTRACTION      Family History  Problem Relation Age of Onset   Heart failure Father        heart valve problem    Hypertension Father    Breast cancer Sister    Crohn's disease Sister    Heart failure Maternal Grandmother    Colon cancer Neg Hx    Esophageal cancer Neg Hx    Rectal cancer Neg Hx    Stomach cancer Neg Hx     Social History   Socioeconomic History   Marital status: Married    Spouse name: Not on file   Number of children: Not on file   Years of education: Not on file   Highest education level: Not on file  Occupational History   Not on file  Tobacco Use  Smoking status: Every Day    Packs/day: 1.00    Years: 30.00    Additional pack years: 0.00    Total pack years: 30.00    Types: Cigarettes   Smokeless tobacco: Never  Vaping Use   Vaping Use: Never used  Substance and Sexual Activity   Alcohol use: Yes    Alcohol/week: 7.0 standard drinks of alcohol    Types: 7 Glasses of wine per week   Drug use: No   Sexual activity: Yes    Birth control/protection: Surgical    Comment: Abd Hysterectomy  Other Topics Concern   Not on file  Social History Narrative   Occupation: Chiropodist Degree   Married   Regular exercise- no   Smoker    HH of 2   5 pets  2 dogs and cats.  One outside cat.    Moved from [place] from job   G0P0   Social Determinants of Health   Financial Resource Strain: Not on file  Food Insecurity: Not on file  Transportation Needs: Not on file  Physical  Activity: Not on file  Stress: Not on file  Social Connections: Not on file    Outpatient Medications Prior to Visit  Medication Sig Dispense Refill   atorvastatin (LIPITOR) 40 MG tablet TAKE 1 TABLET DAILY 90 tablet 3   Biotin 5000 MCG SUBL Place under the tongue.     Calcium Citrate (CITRACAL PO) Take by mouth 2 (two) times daily.     Cholecalciferol 125 MCG (5000 UT) TABS Take 5,000 units (1 tablet) by mouth every other day, alternating with 10,000 units (2 tablets) every other day.     citalopram (CELEXA) 10 MG tablet TAKE 1 TABLET DAILY; SCHEDULE ANNUAL VISIT FOR FUTURE REFILLS 30 tablet 0   gabapentin (NEURONTIN) 300 MG capsule Take 1 capsule (300 mg total) by mouth 2 (two) times daily as needed for up to 7 days. 14 capsule 0   glucosamine-chondroitin 500-400 MG tablet Take 1 tablet by mouth 3 (three) times daily.       MULTIPLE VITAMIN PO Take by mouth. Reported on 09/20/2015     OTEZLA 30 MG TABS Take 1 tablet by mouth 2 (two) times daily.     Probiotic Product (ALIGN) 4 MG CAPS Take by mouth.       tamoxifen (NOLVADEX) 10 MG tablet Take by mouth. (Patient not taking: Reported on 02/20/2022)     No facility-administered medications prior to visit.     EXAM:  There were no vitals taken for this visit.  There is no height or weight on file to calculate BMI. Wt Readings from Last 3 Encounters:  02/20/22 140 lb (63.5 kg)  01/24/22 140 lb (63.5 kg)  07/17/21 144 lb 9.6 oz (65.6 kg)    Physical Exam: Vital signs reviewed [id number] is a well-developed well-nourished alert cooperative    who appearsr stated age in no acute distress.  HEENT: normocephalic atraumatic , Eyes: PERRL EOM's full, conjunctiva clear, Nares: paten,t no deformity discharge or tenderness., Ears: no deformity EAC's clear TMs with normal landmarks. Mouth: clear OP, no lesions, edema.  Moist mucous membranes. Dentition in adequate repair. NECK: supple without masses, thyromegaly or bruits. CHEST/PULM:  Clear to  auscultation and percussion breath sounds equal no wheeze , rales or rhonchi. No chest wall deformities or tenderness. Breast: normal by inspection . No dimpling, discharge, masses, tenderness or discharge . CV: PMI is nondisplaced, S1 S2 no gallops, murmurs, rubs. Peripheral pulses are  full without delay.No JVD .  ABDOMEN: Bowel sounds normal nontender  No guard or rebound, no hepato splenomegal no CVA tenderness.  No hernia. Extremtities:  No clubbing cyanosis or edema, no acute joint swelling or redness no focal atrophy NEURO:  Oriented x3, cranial nerves 3-12 appear to be intact, no obvious focal weakness,gait within normal limits no abnormal reflexes or asymmetrical SKIN: No acute rashes normal turgor, color, no bruising or petechiae. PSYCH: Oriented, good eye contact, no obvious depression anxiety, cognition and judgment appear normal. LN: no cervical axillary inguinal adenopathy  Lab Results  Component Value Date   WBC 8.0 07/17/2021   HGB 14.8 07/17/2021   HCT 44.1 07/17/2021   PLT 257.0 07/17/2021   GLUCOSE 94 07/17/2021   CHOL 174 11/27/2021   TRIG 119.0 11/27/2021   HDL 70.20 11/27/2021   LDLDIRECT 178.9 09/18/2012   LDLCALC 80 11/27/2021   ALT 13 07/17/2021   AST 18 07/17/2021   NA 140 07/17/2021   K 4.1 07/17/2021   CL 104 07/17/2021   CREATININE 0.84 07/17/2021   BUN 13 07/17/2021   CO2 28 07/17/2021   TSH 1.31 07/17/2021   INR 0.90 02/07/2017   HGBA1C 5.6 07/17/2021    BP Readings from Last 3 Encounters:  02/20/22 134/67  07/17/21 118/76  03/10/20 128/66    Lab results reviewed with patient   ASSESSMENT AND PLAN:  Discussed the following assessment and plan:    ICD-10-CM   1. Visit for preventive health examination  Z00.00     2. Medication management  Z79.899     3. Hyperlipidemia, unspecified hyperlipidemia type  E78.5     4. Vascular calcification  I99.8      No follow-ups on file.  Patient Care Team: Yamari Ventola, Standley Brooking, MD as PCP -  General Draelos, Zoe, MD (Dermatology) Judie Bonus, MD (Obstetrics and Gynecology) Jeannette How. Arredondo (Breast Surgery) Croitoru, Dani Gobble, MD as Consulting Physician (Cardiology) There are no Patient Instructions on file for this visit.  Standley Brooking. Zamaya Rapaport M.D.

## 2022-09-10 ENCOUNTER — Encounter: Payer: Self-pay | Admitting: Internal Medicine

## 2022-09-10 ENCOUNTER — Ambulatory Visit: Payer: 59 | Admitting: Internal Medicine

## 2022-09-10 VITALS — BP 112/72 | HR 75 | Temp 98.0°F | Ht 65.25 in | Wt 145.8 lb

## 2022-09-10 DIAGNOSIS — Z79899 Other long term (current) drug therapy: Secondary | ICD-10-CM

## 2022-09-10 DIAGNOSIS — F410 Panic disorder [episodic paroxysmal anxiety] without agoraphobia: Secondary | ICD-10-CM | POA: Diagnosis not present

## 2022-09-10 DIAGNOSIS — Z0000 Encounter for general adult medical examination without abnormal findings: Secondary | ICD-10-CM | POA: Diagnosis not present

## 2022-09-10 DIAGNOSIS — F17200 Nicotine dependence, unspecified, uncomplicated: Secondary | ICD-10-CM | POA: Diagnosis not present

## 2022-09-10 DIAGNOSIS — D0512 Intraductal carcinoma in situ of left breast: Secondary | ICD-10-CM | POA: Diagnosis not present

## 2022-09-10 DIAGNOSIS — E785 Hyperlipidemia, unspecified: Secondary | ICD-10-CM

## 2022-09-10 DIAGNOSIS — I998 Other disorder of circulatory system: Secondary | ICD-10-CM

## 2022-09-10 MED ORDER — CITALOPRAM HYDROBROMIDE 10 MG PO TABS: ORAL_TABLET | ORAL | 3 refills | Status: DC

## 2022-09-10 NOTE — Patient Instructions (Addendum)
Good to see you today  Consider lung cancer screening program  referring to  screening program .  Lab today .  Will refill citalopram 90 days with refills

## 2022-09-11 DIAGNOSIS — R928 Other abnormal and inconclusive findings on diagnostic imaging of breast: Secondary | ICD-10-CM | POA: Diagnosis not present

## 2022-09-11 DIAGNOSIS — N6489 Other specified disorders of breast: Secondary | ICD-10-CM | POA: Diagnosis not present

## 2022-09-11 DIAGNOSIS — L7634 Postprocedural seroma of skin and subcutaneous tissue following other procedure: Secondary | ICD-10-CM | POA: Diagnosis not present

## 2022-09-11 DIAGNOSIS — C50919 Malignant neoplasm of unspecified site of unspecified female breast: Secondary | ICD-10-CM | POA: Diagnosis not present

## 2022-09-11 LAB — HEPATIC FUNCTION PANEL
ALT: 16 U/L (ref 0–35)
AST: 20 U/L (ref 0–37)
Albumin: 4.3 g/dL (ref 3.5–5.2)
Alkaline Phosphatase: 85 U/L (ref 39–117)
Bilirubin, Direct: 0.1 mg/dL (ref 0.0–0.3)
Total Bilirubin: 0.4 mg/dL (ref 0.2–1.2)
Total Protein: 7.1 g/dL (ref 6.0–8.3)

## 2022-09-11 LAB — LIPID PANEL
Cholesterol: 189 mg/dL (ref 0–200)
HDL: 74.6 mg/dL (ref >39.00)
LDL Cholesterol: 80 mg/dL (ref 0–99)
NonHDL: 114.08
Total CHOL/HDL Ratio: 3
Triglycerides: 172 mg/dL — ABNORMAL HIGH (ref 0.0–149.0)
VLDL: 34.4 mg/dL (ref 0.0–40.0)

## 2022-09-11 LAB — CBC WITH DIFFERENTIAL/PLATELET
Basophils Absolute: 0.1 10*3/uL (ref 0.0–0.1)
Basophils Relative: 1.2 % (ref 0.0–3.0)
Eosinophils Absolute: 0.2 10*3/uL (ref 0.0–0.7)
Eosinophils Relative: 2.5 % (ref 0.0–5.0)
HCT: 42.4 % (ref 36.0–46.0)
Hemoglobin: 14.5 g/dL (ref 12.0–15.0)
Lymphocytes Relative: 28.6 % (ref 12.0–46.0)
Lymphs Abs: 2.1 10*3/uL (ref 0.7–4.0)
MCHC: 34.2 g/dL (ref 30.0–36.0)
MCV: 102.5 fl — ABNORMAL HIGH (ref 78.0–100.0)
Monocytes Absolute: 0.4 10*3/uL (ref 0.1–1.0)
Monocytes Relative: 5.8 % (ref 3.0–12.0)
Neutro Abs: 4.5 10*3/uL (ref 1.4–7.7)
Neutrophils Relative %: 61.9 % (ref 43.0–77.0)
Platelets: 245 10*3/uL (ref 150.0–400.0)
RBC: 4.14 Mil/uL (ref 3.87–5.11)
RDW: 12.5 % (ref 11.5–15.5)
WBC: 7.2 10*3/uL (ref 4.0–10.5)

## 2022-09-11 LAB — TSH: TSH: 1.14 u[IU]/mL (ref 0.35–5.50)

## 2022-09-11 LAB — BASIC METABOLIC PANEL
BUN: 12 mg/dL (ref 6–23)
CO2: 24 mEq/L (ref 19–32)
Calcium: 9.4 mg/dL (ref 8.4–10.5)
Chloride: 105 mEq/L (ref 96–112)
Creatinine, Ser: 0.82 mg/dL (ref 0.40–1.20)
GFR: 78.12 mL/min (ref >60.00)
Glucose, Bld: 96 mg/dL (ref 70–99)
Potassium: 4 mEq/L (ref 3.5–5.1)
Sodium: 139 mEq/L (ref 135–145)

## 2022-09-11 NOTE — Progress Notes (Signed)
Labs in range except slight elevation of triglycerides that can be controlled with attention to  avoiding sugars  fats and adding exercise.  The MCV of the blood count continues   . Not sure why sometimes not enough b 12 folic acid can do this   or thyroid  inadequacy   So . Not a major concern but would advised checking cbc diff and methylmalonic acid at next blood check  Your b12 levels have been normal in the past.

## 2022-10-01 ENCOUNTER — Encounter: Payer: Self-pay | Admitting: Internal Medicine

## 2022-10-02 MED ORDER — ATORVASTATIN CALCIUM 40 MG PO TABS: 40.0000 mg | ORAL_TABLET | Freq: Every day | ORAL | 3 refills | Status: DC

## 2022-11-04 DIAGNOSIS — C50912 Malignant neoplasm of unspecified site of left female breast: Secondary | ICD-10-CM | POA: Diagnosis not present

## 2022-11-04 DIAGNOSIS — Z170 Estrogen receptor positive status [ER+]: Secondary | ICD-10-CM | POA: Diagnosis not present

## 2023-01-15 ENCOUNTER — Encounter: Payer: Self-pay | Admitting: Emergency Medicine

## 2023-05-14 ENCOUNTER — Other Ambulatory Visit: Payer: Self-pay | Admitting: Internal Medicine

## 2023-05-19 DIAGNOSIS — Z86000 Personal history of in-situ neoplasm of breast: Secondary | ICD-10-CM | POA: Diagnosis not present

## 2023-05-19 DIAGNOSIS — Z09 Encounter for follow-up examination after completed treatment for conditions other than malignant neoplasm: Secondary | ICD-10-CM | POA: Diagnosis not present

## 2023-05-19 DIAGNOSIS — D0512 Intraductal carcinoma in situ of left breast: Secondary | ICD-10-CM | POA: Diagnosis not present

## 2023-07-31 DIAGNOSIS — L732 Hidradenitis suppurativa: Secondary | ICD-10-CM | POA: Diagnosis not present

## 2023-07-31 DIAGNOSIS — M67442 Ganglion, left hand: Secondary | ICD-10-CM | POA: Diagnosis not present

## 2023-07-31 DIAGNOSIS — L403 Pustulosis palmaris et plantaris: Secondary | ICD-10-CM | POA: Diagnosis not present

## 2023-09-15 DIAGNOSIS — C50912 Malignant neoplasm of unspecified site of left female breast: Secondary | ICD-10-CM | POA: Diagnosis not present

## 2023-09-15 DIAGNOSIS — R92323 Mammographic fibroglandular density, bilateral breasts: Secondary | ICD-10-CM | POA: Diagnosis not present

## 2023-09-15 DIAGNOSIS — Z170 Estrogen receptor positive status [ER+]: Secondary | ICD-10-CM | POA: Diagnosis not present

## 2023-09-24 ENCOUNTER — Other Ambulatory Visit: Payer: Self-pay | Admitting: Internal Medicine

## 2023-09-26 NOTE — Telephone Encounter (Signed)
 Contacted pt and schedule a physical appt on 10/15/2023.

## 2023-10-14 NOTE — Progress Notes (Unsigned)
 No chief complaint on file.   HPI: Patient  Jessica Cooper  63 y.o. comes in today for Preventive Health Care visit   Health Maintenance  Topic Date Due   COVID-19 Vaccine (1) Never done   OPHTHALMOLOGY EXAM  Never done   Diabetic kidney evaluation - Urine ACR  Never done   Pneumococcal Vaccine 51-8 Years old (1 of 2 - PCV) Never done   Zoster Vaccines- Shingrix (1 of 2) Never done   Cervical Cancer Screening (HPV/Pap Cotest)  08/24/2014   Lung Cancer Screening  08/15/2018   MAMMOGRAM  08/15/2023   Diabetic kidney evaluation - eGFR measurement  09/10/2023   DTaP/Tdap/Td (3 - Td or Tdap) 12/28/2023   INFLUENZA VACCINE  01/23/2024   Colonoscopy  02/21/2027   Hepatitis C Screening  Completed   HIV Screening  Completed   HPV VACCINES  Aged Out   Meningococcal B Vaccine  Aged Out   Health Maintenance Review LIFESTYLE:  Exercise:   Tobacco/ETS: Alcohol:  Sugar beverages: Sleep: Drug use: no HH of  Work:    ROS:  GEN/ HEENT: No fever, significant weight changes sweats headaches vision problems hearing changes, CV/ PULM; No chest pain shortness of breath cough, syncope,edema  change in exercise tolerance. GI /GU: No adominal pain, vomiting, change in bowel habits. No blood in the stool. No significant GU symptoms. SKIN/HEME: ,no acute skin rashes suspicious lesions or bleeding. No lymphadenopathy, nodules, masses.  NEURO/ PSYCH:  No neurologic signs such as weakness numbness. No depression anxiety. IMM/ Allergy: No unusual infections.  Allergy .   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   Depression    Diverticulosis    Hip pain    referred femoral pain; itband syndrome and snapping hip syndrome   Hyperlipidemia    runs around 245 - diet controlled, no meds   Internal hemorrhoids    Neuromuscular disorder (HCC)    partial malrotation of mid gut    Osteopenia    Thyroid  disease    Hyperparathyroidism   Tubular adenoma of colon     Past Surgical History:  Procedure Laterality Date   ABDOMINAL HYSTERECTOMY  08/2008   fibroid bleeding   BREAST BIOPSY Right    Aug 2015-"tracker' placed for hyperplasia cells   BREAST BIOPSY Left 2023   CERVICAL CONE BIOPSY     COLONOSCOPY     IR RADIOLOGIST EVAL & MGMT  02/04/2017   MOHS SURGERY     PARATHYROIDECTOMY Bilateral    POLYPECTOMY     REDUCTION MAMMAPLASTY  2023   WISDOM TOOTH EXTRACTION      Family History  Problem Relation Age of Onset   Heart failure Father        heart valve problem    Hypertension Father    Breast cancer Sister    Crohn's disease Sister    Heart failure Maternal Grandmother    Colon cancer Neg Hx    Esophageal cancer Neg Hx    Rectal cancer Neg Hx    Stomach cancer Neg Hx     Social History   Socioeconomic History   Marital status: Married    Spouse name: Not on file   Number of children: Not on file   Years of education: Not on file   Highest education level: Not on file  Occupational History   Not on file  Tobacco Use   Smoking status: Every Day    Current packs/day: 1.00    Average packs/day: 1 pack/day for 30.0 years (30.0 ttl pk-yrs)    Types: Cigarettes   Smokeless tobacco: Never  Vaping Use   Vaping status: Never Used  Substance and Sexual Activity   Alcohol use: Yes    Alcohol/week: 7.0 standard drinks of alcohol    Types: 7 Glasses of wine per week   Drug use: No   Sexual activity: Yes    Birth control/protection: Surgical    Comment: Abd Hysterectomy  Other Topics Concern   Not on file  Social History Narrative   Occupation: Dietitian Degree   Married   Regular exercise- no   Smoker    HH of 2   5 pets  2 dogs and cats.  One outside cat.    Moved from [place] from job   G0P0   Social Drivers of Corporate investment banker Strain: Not on file  Food Insecurity: Not on file  Transportation Needs:  Not on file  Physical Activity: Not on file  Stress: Not on file  Social Connections: Not on file    Outpatient Medications Prior to Visit  Medication Sig Dispense Refill   atorvastatin  (LIPITOR) 40 MG tablet Take 1 tablet (40 mg total) by mouth daily. 90 tablet 3   Biotin 5000 MCG SUBL Place under the tongue.     Calcium  Citrate (CITRACAL PO) Take by mouth 2 (two) times daily.     Cholecalciferol 125 MCG (5000 UT) TABS Take 5,000 units (1 tablet) by mouth every other day, alternating with 10,000 units (2 tablets) every other day.     citalopram  (CELEXA ) 10 MG tablet TAKE 1 TABLET DAILY 30 tablet 0   glucosamine-chondroitin 500-400 MG tablet Take 1 tablet by mouth 3 (three) times daily.       MULTIPLE VITAMIN PO Take by mouth. Reported on 09/20/2015     OTEZLA 30 MG TABS Take 1 tablet by mouth 2 (two) times daily.     Probiotic Product (ALIGN) 4 MG CAPS Take by mouth.       No facility-administered medications prior to visit.     EXAM:  There were no vitals taken for this visit.  There is no height or weight on file to calculate BMI. Wt Readings from Last 3 Encounters:  09/10/22 145 lb 12.8 oz (66.1 kg)  02/20/22 140 lb (63.5 kg)  01/24/22 140 lb (63.5 kg)    Physical Exam: Vital signs reviewed [id number] is a well-developed well-nourished alert cooperative    who appearsr stated age in no acute distress.  HEENT: normocephalic atraumatic , Eyes: PERRL EOM's full, conjunctiva clear, Nares: paten,t no deformity discharge or tenderness., Ears: no deformity EAC's clear TMs with normal landmarks. Mouth: clear OP, no lesions, edema.  Moist mucous membranes. Dentition in adequate repair. NECK: supple without masses, thyromegaly or bruits. CHEST/PULM:  Clear to auscultation and percussion breath sounds equal no wheeze , rales or rhonchi. No chest wall deformities or tenderness. Breast: normal by inspection . No dimpling, discharge, masses, tenderness or discharge . CV: PMI is  nondisplaced, S1 S2 no gallops, murmurs, rubs. Peripheral pulses are full without delay.No JVD .  ABDOMEN: Bowel sounds normal nontender  No guard or rebound, no hepato splenomegal no CVA tenderness.  No hernia. Extremtities:  No clubbing cyanosis or edema, no acute joint swelling or redness no focal atrophy  NEURO:  Oriented x3, cranial nerves 3-12 appear to be intact, no obvious focal weakness,gait within normal limits no abnormal reflexes or asymmetrical SKIN: No acute rashes normal turgor, color, no bruising or petechiae. PSYCH: Oriented, good eye contact, no obvious depression anxiety, cognition and judgment appear normal. LN: no cervical axillary inguinal adenopathy  Lab Results  Component Value Date   WBC 7.2 09/10/2022   HGB 14.5 09/10/2022   HCT 42.4 09/10/2022   PLT 245.0 09/10/2022   GLUCOSE 96 09/10/2022   CHOL 189 09/10/2022   TRIG 172.0 (H) 09/10/2022   HDL 74.60 09/10/2022   LDLDIRECT 178.9 09/18/2012   LDLCALC 80 09/10/2022   ALT 16 09/10/2022   AST 20 09/10/2022   NA 139 09/10/2022   K 4.0 09/10/2022   CL 105 09/10/2022   CREATININE 0.82 09/10/2022   BUN 12 09/10/2022   CO2 24 09/10/2022   TSH 1.14 09/10/2022   INR 0.90 02/07/2017   HGBA1C 5.6 07/17/2021    BP Readings from Last 3 Encounters:  09/10/22 112/72  02/20/22 134/67  07/17/21 118/76    Lab results reviewed with patient   ASSESSMENT AND PLAN:  Discussed the following assessment and plan:    ICD-10-CM   1. Visit for preventive health examination  Z00.00     2. Medication management  Z79.899     3. Vascular calcification  I99.8     4. Hyperlipidemia, unspecified hyperlipidemia type  E78.5      No follow-ups on file.  Patient Care Team: Jovani Flury, Joaquim Muir, MD as PCP - General Draelos, Zoe, MD (Dermatology) Garrick Kanaris, MD (Obstetrics and Gynecology) Ma Sau[place]. Arredondo (Breast Surgery) Croitoru, Karyl Paget, MD as Consulting Physician (Cardiology) There are no Patient Instructions on  file for this visit.  Margert Edsall K. Amandalee Lacap M.D.

## 2023-10-15 ENCOUNTER — Encounter: Payer: Self-pay | Admitting: Internal Medicine

## 2023-10-15 ENCOUNTER — Ambulatory Visit (INDEPENDENT_AMBULATORY_CARE_PROVIDER_SITE_OTHER): Admitting: Internal Medicine

## 2023-10-15 VITALS — BP 110/70 | HR 69 | Temp 98.0°F | Ht 65.25 in | Wt 137.8 lb

## 2023-10-15 DIAGNOSIS — Z0000 Encounter for general adult medical examination without abnormal findings: Secondary | ICD-10-CM

## 2023-10-15 DIAGNOSIS — Z79899 Other long term (current) drug therapy: Secondary | ICD-10-CM | POA: Diagnosis not present

## 2023-10-15 DIAGNOSIS — I998 Other disorder of circulatory system: Secondary | ICD-10-CM

## 2023-10-15 DIAGNOSIS — F17200 Nicotine dependence, unspecified, uncomplicated: Secondary | ICD-10-CM

## 2023-10-15 DIAGNOSIS — N949 Unspecified condition associated with female genital organs and menstrual cycle: Secondary | ICD-10-CM

## 2023-10-15 DIAGNOSIS — E785 Hyperlipidemia, unspecified: Secondary | ICD-10-CM

## 2023-10-15 LAB — MICROALBUMIN / CREATININE URINE RATIO
Creatinine,U: 26.1 mg/dL
Microalb Creat Ratio: UNDETERMINED mg/g (ref 0.0–30.0)
Microalb, Ur: <0.7 mg/dL

## 2023-10-15 LAB — CBC WITH DIFFERENTIAL/PLATELET
Basophils Absolute: 0 10*3/uL (ref 0.0–0.1)
Basophils Relative: 0.6 % (ref 0.0–3.0)
Eosinophils Absolute: 0.1 10*3/uL (ref 0.0–0.7)
Eosinophils Relative: 1.8 % (ref 0.0–5.0)
HCT: 44.6 % (ref 36.0–46.0)
Hemoglobin: 15.3 g/dL — ABNORMAL HIGH (ref 12.0–15.0)
Lymphocytes Relative: 21.3 % (ref 12.0–46.0)
Lymphs Abs: 1.6 10*3/uL (ref 0.7–4.0)
MCHC: 34.3 g/dL (ref 30.0–36.0)
MCV: 104 fl — ABNORMAL HIGH (ref 78.0–100.0)
Monocytes Absolute: 0.5 10*3/uL (ref 0.1–1.0)
Monocytes Relative: 6.3 % (ref 3.0–12.0)
Neutro Abs: 5.1 10*3/uL (ref 1.4–7.7)
Neutrophils Relative %: 70 % (ref 43.0–77.0)
Platelets: 232 10*3/uL (ref 150.0–400.0)
RBC: 4.28 Mil/uL (ref 3.87–5.11)
RDW: 13.5 % (ref 11.5–15.5)
WBC: 7.3 10*3/uL (ref 4.0–10.5)

## 2023-10-15 LAB — COMPREHENSIVE METABOLIC PANEL WITH GFR
ALT: 17 U/L (ref 0–35)
AST: 20 U/L (ref 0–37)
Albumin: 4.6 g/dL (ref 3.5–5.2)
Alkaline Phosphatase: 82 U/L (ref 39–117)
BUN: 12 mg/dL (ref 6–23)
CO2: 29 meq/L (ref 19–32)
Calcium: 9.8 mg/dL (ref 8.4–10.5)
Chloride: 104 meq/L (ref 96–112)
Creatinine, Ser: 0.79 mg/dL (ref 0.40–1.20)
GFR: 81.07 mL/min (ref >60.00)
Glucose, Bld: 92 mg/dL (ref 70–99)
Potassium: 4.4 meq/L (ref 3.5–5.1)
Sodium: 141 meq/L (ref 135–145)
Total Bilirubin: 0.4 mg/dL (ref 0.2–1.2)
Total Protein: 7.4 g/dL (ref 6.0–8.3)

## 2023-10-15 LAB — TSH: TSH: 1.72 u[IU]/mL (ref 0.35–5.50)

## 2023-10-15 LAB — LIPID PANEL
Cholesterol: 213 mg/dL — ABNORMAL HIGH (ref 0–200)
HDL: 76.6 mg/dL (ref >39.00)
LDL Cholesterol: 115 mg/dL — ABNORMAL HIGH (ref 0–99)
NonHDL: 136.46
Total CHOL/HDL Ratio: 3
Triglycerides: 109 mg/dL (ref 0.0–149.0)
VLDL: 21.8 mg/dL (ref 0.0–40.0)

## 2023-10-15 LAB — HEMOGLOBIN A1C: Hgb A1c MFr Bld: 5.7 % (ref 4.6–6.5)

## 2023-10-15 MED ORDER — ATORVASTATIN CALCIUM 40 MG PO TABS: 40.0000 mg | ORAL_TABLET | Freq: Every day | ORAL | 3 refills | Status: DC

## 2023-10-15 NOTE — Patient Instructions (Signed)
 Good to see  you today . Still want  you to  stop tobacco.   Consider going back up  to 40 mg  per day of atorvastatin .

## 2023-10-17 LAB — LIPOPROTEIN A (LPA): Lipoprotein (a): 178 nmol/L — ABNORMAL HIGH (ref <75)

## 2023-10-20 ENCOUNTER — Encounter: Payer: Self-pay | Admitting: Internal Medicine

## 2023-10-20 NOTE — Progress Notes (Signed)
 Cholesterol panel up some as we expected  on lower does of atorvastatin   However  Lipo A level is very high ,associated with increased future  vascular event risk . So  I would advise going back on full dose atorvastatin  40 per day as long as tolerated

## 2023-10-22 ENCOUNTER — Other Ambulatory Visit: Payer: Self-pay | Admitting: Internal Medicine

## 2023-10-23 MED ORDER — CITALOPRAM HYDROBROMIDE 10 MG PO TABS
10.0000 mg | ORAL_TABLET | Freq: Every day | ORAL | 0 refills | Status: DC
Start: 2023-10-23 — End: 2023-12-11

## 2023-10-29 ENCOUNTER — Telehealth: Payer: Self-pay | Admitting: Cardiovascular Disease

## 2023-10-29 DIAGNOSIS — E785 Hyperlipidemia, unspecified: Secondary | ICD-10-CM

## 2023-10-29 DIAGNOSIS — E7800 Pure hypercholesterolemia, unspecified: Secondary | ICD-10-CM

## 2023-10-29 NOTE — Telephone Encounter (Signed)
 Patient states PCP ordered Lipoprotein(a) for her.Paper chart requested. Shows its 178 and LDL from same day is 115. Educated her on the importance of lipoprotein a and why we check it. Her LDL from last year was much better at 62, she states she had own her own, cut her Atorvastatin  in half and her PCP informed her that based on these labs, she does need to increase back to 40mg . She has done this. Advised her that she likely will need zetia also if repeat labs in 3 months doesn't show LDL less than . Educated about coronary calcium  CT score scanning and she would like to have that done. She wants to know if [facility] will order it. Last seen in 2018. Will route to MD for input.

## 2023-10-29 NOTE — Telephone Encounter (Signed)
 Patient called to report she had lab work done recently and her lipoprotein level is very high.  Patient wants advice on next steps.

## 2023-10-30 NOTE — Telephone Encounter (Signed)
 Called and spoke with the patient- ID X 2 Informed that a CT Coronary Calcium  Score has been ordered. Someone from scheduling will call to get this scheduled. She verbalized understanding.

## 2023-10-30 NOTE — Telephone Encounter (Signed)
 Yes, please order the coronary calcium  score. Re: hypercholesterolemia.

## 2023-11-07 ENCOUNTER — Ambulatory Visit (HOSPITAL_BASED_OUTPATIENT_CLINIC_OR_DEPARTMENT_OTHER)
Admission: RE | Admit: 2023-11-07 | Discharge: 2023-11-07 | Disposition: A | Discharge status: Home / Self Care | Payer: Self-pay | Source: Ambulatory Visit | Attending: Cardiovascular Disease | Admitting: Cardiovascular Disease

## 2023-11-07 DIAGNOSIS — E785 Hyperlipidemia, unspecified: Secondary | ICD-10-CM | POA: Insufficient documentation

## 2023-11-07 DIAGNOSIS — E7800 Pure hypercholesterolemia, unspecified: Secondary | ICD-10-CM | POA: Insufficient documentation

## 2023-11-11 ENCOUNTER — Ambulatory Visit: Payer: Self-pay | Admitting: Cardiovascular Disease

## 2023-11-11 ENCOUNTER — Encounter: Payer: Self-pay | Admitting: Internal Medicine

## 2023-11-11 DIAGNOSIS — E785 Hyperlipidemia, unspecified: Secondary | ICD-10-CM

## 2023-11-11 DIAGNOSIS — I2510 Atherosclerotic heart disease of native coronary artery without angina pectoris: Secondary | ICD-10-CM

## 2023-11-11 MED ORDER — EZETIMIBE 10 MG PO TABS: 10.0000 mg | ORAL_TABLET | Freq: Every day | ORAL | 3 refills | Status: DC

## 2023-11-11 NOTE — Telephone Encounter (Signed)
 Called and spoke to pt. Discussed new medication and lab recommendations. Pt has been scheduled to see Dr. Tita Form on 7/18 @ 9:20 AM. Lab slips mailed to p and new rx has been sent to preferred pharmacy. Pt verbalized understanding.

## 2023-11-12 MED ORDER — EZETIMIBE 10 MG PO TABS: 10.0000 mg | ORAL_TABLET | Freq: Every day | ORAL | 3 refills | Status: AC

## 2023-11-12 MED ORDER — EZETIMIBE 10 MG PO TABS
10.0000 mg | ORAL_TABLET | Freq: Every day | ORAL | 3 refills | Status: DC
Start: 2023-11-12 — End: 2023-11-12

## 2023-11-12 MED ORDER — EZETIMIBE 10 MG PO TABS: 10.0000 mg | ORAL_TABLET | Freq: Every day | ORAL | 3 refills | Status: DC

## 2023-11-12 NOTE — Addendum Note (Signed)
 Addended by: Zeb Heys on: 11/12/2023 04:21 PM   Modules accepted: Orders

## 2023-11-12 NOTE — Addendum Note (Signed)
 Addended by: Zeb Heys on: 11/12/2023 04:28 PM   Modules accepted: Orders

## 2023-11-12 NOTE — Addendum Note (Signed)
 Addended by: Zeb Heys on: 11/12/2023 04:32 PM   Modules accepted: Orders

## 2023-11-27 DIAGNOSIS — Z01419 Encounter for gynecological examination (general) (routine) without abnormal findings: Secondary | ICD-10-CM | POA: Diagnosis not present

## 2023-11-27 DIAGNOSIS — N952 Postmenopausal atrophic vaginitis: Secondary | ICD-10-CM | POA: Diagnosis not present

## 2023-11-27 DIAGNOSIS — N905 Atrophy of vulva: Secondary | ICD-10-CM | POA: Diagnosis not present

## 2023-11-27 DIAGNOSIS — Z86001 Personal history of in-situ neoplasm of cervix uteri: Secondary | ICD-10-CM | POA: Diagnosis not present

## 2023-11-27 DIAGNOSIS — N941 Unspecified dyspareunia: Secondary | ICD-10-CM | POA: Diagnosis not present

## 2023-11-27 DIAGNOSIS — Z8742 Personal history of other diseases of the female genital tract: Secondary | ICD-10-CM | POA: Diagnosis not present

## 2023-11-27 DIAGNOSIS — Z1151 Encounter for screening for human papillomavirus (HPV): Secondary | ICD-10-CM | POA: Diagnosis not present

## 2023-11-27 DIAGNOSIS — Z1272 Encounter for screening for malignant neoplasm of vagina: Secondary | ICD-10-CM | POA: Diagnosis not present

## 2023-12-11 ENCOUNTER — Other Ambulatory Visit: Payer: Self-pay | Admitting: Internal Medicine

## 2024-01-07 NOTE — Progress Notes (Signed)
 Cardiology Consultation Note:    Date:  01/18/2024   ID:  Jessica Cooper, DOB [birth year], MRN [id number]  PCP:  Jessica Apolinar POUR, MD  Cardiologist:  Jessica Balding, MD    Referring MD: Jessica Apolinar POUR, MD   No chief complaint on file. Jessica Cooper is a 63 y.o. female who is being seen today for the evaluation of elevated coronary calcium  score at the request of Jessica Cooper, Apolinar POUR, MD.   History of Present Illness:    Jessica Cooper is a 63 y.o. female with a hx of hypercholesterolemia and a family history of heart failure and coronary artery disease, ongoing cigarette abuse and peri/post-menopausal state, presents for evaluation of markedly elevated coronary calcium  score.  Coronary calcium  score performed 11/07/2023 was markedly elevated at 717, 99th percentile for age and gender.  Her most recent lipid profile performed 10/15/2023 showed elevated total cholesterol 213, LDL 115, excellent HDL at 77 and triglycerides 109.  Not sure which she was taking at the time.  On past assays her LDL cholesterol has been as high as 170, but had decreased to 80 on treatment with atorvastatin  20 mg daily.  She also has a moderately elevated LP(a) at 178.  Following the most recent lab results she has been started on atorvastatin  40 mg daily and ezetimibe  10 mg daily by Dr. Charlett.  Both her parents had heart disease. Her father, Jessica Cooper was also my patient (he had nonischemic cardiomyopathy, ICD, passed away in 2019-02-10). Her maternal grandmother also had heart failure but lived to age 48.   She reports that her untreated cholesterol has been 240 or so her whole life. Her HDL is always been good, but this doesn't give her any reassurance since her mother had a similar pattern.   To date, Jessica Cooper has not had angina or exertional dyspnea or other symptoms to suggest coronary artery disease. She walks about a mile a day at a 30 minute mile pace. She denies problems with edema, claudication, focal neurological  complaints, palpitations or syncope.   She has smoked a pack of cigarettes a day for the last 40 years and has tried multiple times to quit smoking. She has tried using Jessica Cooper, Jessica Cooper and nicotine patches unsuccessfully. She finds that smoking helps with problems with depression and anxiety and when she quit smoking she feelt emotionally drained and depressed, sometimes near suicidal.  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   conization   Depression    Diverticulosis    Hip pain    referred femoral pain; itband syndrome and snapping hip syndrome   Hyperlipidemia    runs around 245 - diet controlled, no meds   Internal hemorrhoids    Neuromuscular disorder (HCC)    partial malrotation of mid gut   Osteopenia    Thyroid  disease    Hyperparathyroidism   Tubular adenoma of colon     Past Surgical History:  Procedure Laterality Date   ABDOMINAL HYSTERECTOMY  08/2008   fibroid bleeding   BREAST BIOPSY Right    Aug 2015-tracker' placed for hyperplasia cells   BREAST BIOPSY Left 09-Feb-2022   CERVICAL CONE BIOPSY     COLONOSCOPY     IR RADIOLOGIST EVAL & MGMT  02/04/2017   MOHS SURGERY     PARATHYROIDECTOMY Bilateral    POLYPECTOMY  REDUCTION MAMMAPLASTY  2023   WISDOM TOOTH EXTRACTION      Current Medications: Current Meds  Medication Sig   atorvastatin  (LIPITOR) 40 MG tablet Take 1 tablet (40 mg total) by mouth daily.   Biotin 5000 MCG SUBL Place under the tongue.   Calcium  Citrate (CITRACAL PO) Take by mouth 2 (two) times daily.   Cholecalciferol 125 MCG (5000 UT) TABS Take 5,000 units (1 tablet) by mouth every other day, alternating with 10,000 units (2 tablets) every other day.   citalopram  (CELEXA ) 10 MG tablet TAKE 1 TABLET BY MOUTH EVERY DAY   ezetimibe  (ZETIA ) 10 MG tablet Take 1 tablet (10 mg total) by mouth daily.    glucosamine-chondroitin 500-400 MG tablet Take 1 tablet by mouth 3 (three) times daily.     MULTIPLE VITAMIN PO Take by mouth. Reported on 09/20/2015   Probiotic Product (ALIGN) 4 MG CAPS Take by mouth.       Allergies:   Metronidazole   Family History: The patient's family history includes Breast cancer in her sister; Crohn's disease in her sister; Heart failure in her father and maternal grandmother; Hypertension in her father. There is no history of Colon cancer, Esophageal cancer, Rectal cancer, or Stomach cancer. ROS:   Please see the history of present illness.    All other systems reviewed and are negative.  EKGs/Labs/Other Studies Reviewed:    The following studies were reviewed today: Notes from PCP  EKG:    EKG Interpretation Date/Time:  Friday January 09 2024 09:18:40 EDT Ventricular Rate:  67 PR Interval:  166 QRS Duration:  82 QT Interval:  386 QTC Calculation: 407 R Axis:   49  Text Interpretation: Normal sinus rhythm Biatrial enlargement Low voltage QRS Cannot rule out Anterior infarct , age undetermined No previous ECGs available Confirmed by Jessica Cooper [phone]) on 01/09/2024 10:02:17 AM          Recent Labs: 10/15/2023: ALT 17; BUN 12; Creatinine, Ser 0.79; Hemoglobin 15.3; Platelets 232.0; Potassium 4.4; Sodium 141; TSH 1.72  Recent Lipid Panel    Component Value Date/Time   CHOL 213 (H) 10/15/2023 1239   TRIG 109.0 10/15/2023 1239   HDL 76.60 10/15/2023 1239   CHOLHDL 3 10/15/2023 1239   VLDL 21.8 10/15/2023 1239   LDLCALC 115 (H) 10/15/2023 1239   LDLCALC 96 03/06/2020 0723   LDLDIRECT 178.9 09/18/2012 0854    Physical Exam:    VS:  BP 126/72   Pulse 67   Ht 5' 5 (1.651 m)   Wt 133 lb 6.4 oz (60.5 kg)   SpO2 99%   BMI 22.20 kg/m     Wt Readings from Last 3 Encounters:  01/09/24 133 lb 6.4 oz (60.5 kg)  10/15/23 137 lb 12.8 oz (62.5 kg)  09/10/22 145 lb 12.8 oz (66.1 kg)     GEN:  Well nourished, well developed in no acute  distress HEENT: Normal NECK: No JVD; No carotid bruits LYMPHATICS: No lymphadenopathy CARDIAC: RRR, no murmurs, rubs, gallops RESPIRATORY:  Clear to auscultation without rales, wheezing or rhonchi  ABDOMEN: Soft, non-tender, non-distended MUSCULOSKELETAL:  No edema; No deformity  SKIN: Warm and dry NEUROLOGIC:  Alert and oriented x 3 PSYCHIATRIC:  Normal affect   ASSESSMENT:    1. Coronary artery disease involving native coronary artery of native heart without angina pectoris   2. Hypercholesterolemia   3. Elevated Lp(a)   4. Smoker   5. Elevated coronary artery calcium  score     PLAN:  In order of problems listed above:  CAD: Markedly elevated coronary calcium  score (717, 99th percentile for age and gender, placing her in a risk category equivalent to a patient with established CAD), but she is asymptomatic.  She had a normal treadmill stress test in 2018 when she was able to exercise for just over 10 minutes on the Bruce protocol.  It is reasonable to reevaluate exercise capacity and look for any ECG changes, considering how high her calcium  score is. HLP: Based on the most recent information from her coronary calcium  score, her risk of future coronary events is equivalent to that of a patient with established CAD.  Recommend lipid-lowering to a target LDL cholesterol less than 70.  The current regimen of atorvastatin  40 mg daily plus ezetimibe  10 mg daily should get us  there.  There is already a plan in place for repeat lipid profile with her PCP.  With the moderate elevation in LP(a) she might benefit from PCSK9 inhibitors, especially if we cannot achieve a target LDL less than 70 on statin plus ezetimibe . Smoking: She has tried many times to quit smoking, but finds it difficult to due to her emotional issues. I told her not to give up. We discussed that it takes the average person multiple attempts at smoking cessation until they succeed. It is a very important long-term goal to  avoid multiple possible health problems.  Medication Adjustments/Labs and Tests Ordered: Current medicines are reviewed at length with the patient today.  Concerns regarding medicines are outlined above.  Orders Placed This Encounter  Procedures   EXERCISE TOLERANCE TEST (ETT)   EKG 12-Lead   No orders of the defined types were placed in this encounter.   Signed, Jessica Balding, MD  01/18/2024 1:36 PM    [facility]

## 2024-01-09 ENCOUNTER — Encounter: Payer: Self-pay | Admitting: Cardiovascular Disease

## 2024-01-09 ENCOUNTER — Ambulatory Visit: Attending: Cardiovascular Disease | Admitting: Cardiovascular Disease

## 2024-01-09 VITALS — BP 126/72 | HR 67 | Ht 65.0 in | Wt 133.4 lb

## 2024-01-09 DIAGNOSIS — E7841 Elevated Lipoprotein(a): Secondary | ICD-10-CM | POA: Diagnosis not present

## 2024-01-09 DIAGNOSIS — R931 Abnormal findings on diagnostic imaging of heart and coronary circulation: Secondary | ICD-10-CM

## 2024-01-09 DIAGNOSIS — E7800 Pure hypercholesterolemia, unspecified: Secondary | ICD-10-CM | POA: Diagnosis not present

## 2024-01-09 DIAGNOSIS — F17200 Nicotine dependence, unspecified, uncomplicated: Secondary | ICD-10-CM

## 2024-01-09 DIAGNOSIS — I2510 Atherosclerotic heart disease of native coronary artery without angina pectoris: Secondary | ICD-10-CM | POA: Diagnosis not present

## 2024-01-09 NOTE — Patient Instructions (Signed)
 Medication Instructions:  No changes *If you need a refill on your cardiac medications before your next appointment, please call your pharmacy*  Lab Work: Lipid panel- have drawn in August If you have labs (blood work) drawn today and your tests are completely normal, you will receive your results only by: MyChart Message (if you have MyChart) OR A paper copy in the mail If you have any lab test that is abnormal or we need to change your treatment, we will call you to review the results.  Testing/Procedures:  Your physician has requested that you have an exercise tolerance test. For further information please visit [email or web address] Please also follow instruction sheet, as given.                          Patient Instructions for Exercise Treadmill Stress Test  Medication instructions: N/A  Do not eat, drink or use tobacco products four hours prior to the test.  Water is ok.  3.  Dress prepared to exercise in a comfortable, two piece clothing outfit and walking shoes.  4.  Bring any current prescription medications with you the day of the test.  5.  Notify the office 24 hours in advance if you cannot keep this appointment.  6.  If you have any questions, please call [phone].   Follow-Up: At [facility], you and your health needs are our priority.  As part of our continuing mission to provide you with exceptional heart care, our providers are all part of one team.  This team includes your primary Cardiologist (physician) and Advanced Practice Providers or APPs (Physician Assistants and Nurse Practitioners) who all work together to provide you with the care you need, when you need it.  Your next appointment:   1 year(s)  Provider:   Dr Francyne  We recommend signing up for the patient portal called MyChart.  Sign up information is provided on this After Visit Summary.  MyChart is used to connect with patients for Virtual Visits (Telemedicine).  Patients are able  to view lab/test results, encounter notes, upcoming appointments, etc.  Non-urgent messages can be sent to your provider as well.   To learn more about what you can do with MyChart, go to [email or web address].

## 2024-02-11 ENCOUNTER — Encounter (HOSPITAL_COMMUNITY): Payer: Self-pay | Admitting: *Deleted

## 2024-02-25 ENCOUNTER — Other Ambulatory Visit: Payer: Self-pay | Admitting: Cardiovascular Disease

## 2024-02-25 DIAGNOSIS — E785 Hyperlipidemia, unspecified: Secondary | ICD-10-CM | POA: Diagnosis not present

## 2024-02-26 ENCOUNTER — Ambulatory Visit: Payer: Self-pay | Admitting: Cardiovascular Disease

## 2024-02-26 DIAGNOSIS — E785 Hyperlipidemia, unspecified: Secondary | ICD-10-CM

## 2024-02-26 LAB — LIPID PANEL
Chol/HDL Ratio: 2.6 ratio (ref 0.0–4.4)
Cholesterol, Total: 169 mg/dL (ref 100–199)
HDL: 64 mg/dL (ref >39)
LDL Chol Calc (NIH): 89 mg/dL (ref 0–99)
Triglycerides: 85 mg/dL (ref 0–149)
VLDL Cholesterol Cal: 16 mg/dL (ref 5–40)

## 2024-02-26 MED ORDER — ATORVASTATIN CALCIUM 80 MG PO TABS: 80.0000 mg | ORAL_TABLET | Freq: Every day | ORAL | 3 refills | Status: AC

## 2024-02-26 NOTE — Telephone Encounter (Signed)
 Prescription sent and lipid panel ordered

## 2024-02-27 ENCOUNTER — Ambulatory Visit: Payer: Self-pay | Admitting: Cardiovascular Disease

## 2024-02-27 ENCOUNTER — Ambulatory Visit (HOSPITAL_COMMUNITY)
Admission: RE | Admit: 2024-02-27 | Discharge: 2024-02-27 | Disposition: A | Discharge status: Home / Self Care | Source: Ambulatory Visit | Attending: Cardiovascular Disease | Admitting: Cardiovascular Disease

## 2024-02-27 DIAGNOSIS — I2510 Atherosclerotic heart disease of native coronary artery without angina pectoris: Secondary | ICD-10-CM | POA: Diagnosis not present

## 2024-02-27 DIAGNOSIS — R931 Abnormal findings on diagnostic imaging of heart and coronary circulation: Secondary | ICD-10-CM | POA: Insufficient documentation

## 2024-02-27 LAB — EXERCISE TOLERANCE TEST
Angina Index: 0
Base ST Depression (mm): 0 mm
Duke Treadmill Score: 9
Estimated workload: 10.1
Exercise duration (min): 9 min
Exercise duration (sec): 0 s
MPHR: 159 {beats}/min
Peak HR: 141 {beats}/min
Percent HR: 88 %
RPE: 18
Rest HR: 56 {beats}/min
ST Depression (mm): 0.5 mm

## 2024-05-17 DIAGNOSIS — D0512 Intraductal carcinoma in situ of left breast: Secondary | ICD-10-CM | POA: Diagnosis not present

## 2024-05-17 DIAGNOSIS — F17210 Nicotine dependence, cigarettes, uncomplicated: Secondary | ICD-10-CM | POA: Diagnosis not present

## 2024-05-17 DIAGNOSIS — Z08 Encounter for follow-up examination after completed treatment for malignant neoplasm: Secondary | ICD-10-CM | POA: Diagnosis not present

## 2024-05-17 DIAGNOSIS — Z86000 Personal history of in-situ neoplasm of breast: Secondary | ICD-10-CM | POA: Diagnosis not present

## 2024-05-25 DIAGNOSIS — E785 Hyperlipidemia, unspecified: Secondary | ICD-10-CM | POA: Diagnosis not present

## 2024-05-26 LAB — LIPID PANEL
Chol/HDL Ratio: 2.3 ratio (ref 0.0–4.4)
Cholesterol, Total: 152 mg/dL (ref 100–199)
HDL: 66 mg/dL (ref >39)
LDL Chol Calc (NIH): 70 mg/dL (ref 0–99)
Triglycerides: 86 mg/dL (ref 0–149)
VLDL Cholesterol Cal: 16 mg/dL (ref 5–40)

## 2024-05-31 DIAGNOSIS — H35361 Drusen (degenerative) of macula, right eye: Secondary | ICD-10-CM | POA: Diagnosis not present

## 2024-07-19 ENCOUNTER — Other Ambulatory Visit: Payer: Self-pay | Admitting: Internal Medicine
